# Patient Record
Sex: Male | Born: 1990 | Race: White | Hispanic: No | Marital: Single | Smoking: Never smoker
Health system: Southern US, Community
[De-identification: ages and names within clinical notes are randomized; demographics above are authoritative.]

## PROBLEM LIST (undated history)

## (undated) DIAGNOSIS — B1920 Unspecified viral hepatitis C without hepatic coma: Secondary | ICD-10-CM

## (undated) DIAGNOSIS — K219 Gastro-esophageal reflux disease without esophagitis: Secondary | ICD-10-CM

## (undated) DIAGNOSIS — F1910 Other psychoactive substance abuse, uncomplicated: Secondary | ICD-10-CM

## (undated) HISTORY — DX: Gastro-esophageal reflux disease without esophagitis: K21.9

## (undated) HISTORY — DX: Unspecified viral hepatitis C without hepatic coma: B19.20

---

## 2012-04-20 ENCOUNTER — Emergency Department (HOSPITAL_COMMUNITY): Payer: BC Managed Care – PPO

## 2012-04-20 ENCOUNTER — Emergency Department (HOSPITAL_COMMUNITY)
Admission: EM | Admit: 2012-04-20 | Discharge: 2012-04-21 | Disposition: A | Discharge status: Home / Self Care | Payer: BC Managed Care – PPO | Attending: Emergency Medicine | Admitting: Emergency Medicine

## 2012-04-20 ENCOUNTER — Encounter (HOSPITAL_COMMUNITY): Payer: Self-pay | Admitting: Emergency Medicine

## 2012-04-20 DIAGNOSIS — S20229A Contusion of unspecified back wall of thorax, initial encounter: Secondary | ICD-10-CM

## 2012-04-20 DIAGNOSIS — M549 Dorsalgia, unspecified: Secondary | ICD-10-CM

## 2012-04-20 DIAGNOSIS — R109 Unspecified abdominal pain: Secondary | ICD-10-CM | POA: Insufficient documentation

## 2012-04-20 LAB — COMPREHENSIVE METABOLIC PANEL
AST: 94 U/L — ABNORMAL HIGH (ref 0–37)
Albumin: 4.3 g/dL (ref 3.5–5.2)
Alkaline Phosphatase: 63 U/L (ref 39–117)
BUN: 10 mg/dL (ref 6–23)
Chloride: 99 mEq/L (ref 96–112)
Potassium: 3.9 mEq/L (ref 3.5–5.1)
Total Bilirubin: 1.4 mg/dL — ABNORMAL HIGH (ref 0.3–1.2)

## 2012-04-20 LAB — URINALYSIS, ROUTINE W REFLEX MICROSCOPIC
Glucose, UA: NEGATIVE mg/dL
Ketones, ur: NEGATIVE mg/dL
Protein, ur: 30 mg/dL — AB

## 2012-04-20 LAB — CBC WITH DIFFERENTIAL/PLATELET
Basophils Absolute: 0 10*3/uL (ref 0.0–0.1)
Basophils Relative: 0 % (ref 0–1)
Hemoglobin: 14.4 g/dL (ref 13.0–17.0)
Lymphocytes Relative: 10 % — ABNORMAL LOW (ref 12–46)
MCHC: 34.3 g/dL (ref 30.0–36.0)
Monocytes Relative: 9 % (ref 3–12)
Neutro Abs: 7.3 10*3/uL (ref 1.7–7.7)
Neutrophils Relative %: 81 % — ABNORMAL HIGH (ref 43–77)
WBC: 9 10*3/uL (ref 4.0–10.5)

## 2012-04-20 LAB — URINE MICROSCOPIC-ADD ON

## 2012-04-20 MED ORDER — MORPHINE SULFATE 4 MG/ML IJ SOLN
4.0000 mg | Freq: Once | INTRAMUSCULAR | Status: AC
  Administered 2012-04-20: 4 mg via INTRAVENOUS
  Filled 2012-04-20: qty 1

## 2012-04-20 MED ORDER — HYDROCODONE-ACETAMINOPHEN 5-325 MG PO TABS: 1.0000 | ORAL_TABLET | ORAL | Status: DC | PRN

## 2012-04-20 MED ORDER — IOHEXOL 300 MG/ML  SOLN: 100.0000 mL | Freq: Once | INTRAMUSCULAR | Status: AC | PRN

## 2012-04-20 MED ORDER — SODIUM CHLORIDE 0.9 % IV BOLUS (SEPSIS): 1000.0000 mL | Freq: Once | INTRAVENOUS | Status: DC

## 2012-04-20 MED ORDER — KETOROLAC TROMETHAMINE 30 MG/ML IJ SOLN
30.0000 mg | Freq: Once | INTRAMUSCULAR | Status: AC
  Administered 2012-04-20: 30 mg via INTRAVENOUS
  Filled 2012-04-20: qty 1

## 2012-04-20 MED ORDER — ONDANSETRON HCL 4 MG/2ML IJ SOLN
4.0000 mg | Freq: Once | INTRAMUSCULAR | Status: AC
  Administered 2012-04-20: 4 mg via INTRAVENOUS
  Filled 2012-04-20: qty 2

## 2012-04-20 MED ORDER — MORPHINE SULFATE 4 MG/ML IJ SOLN
6.0000 mg | Freq: Once | INTRAMUSCULAR | Status: AC
  Administered 2012-04-20: 6 mg via INTRAVENOUS
  Filled 2012-04-20: qty 2

## 2012-04-20 MED ORDER — SODIUM CHLORIDE 0.9 % IV BOLUS (SEPSIS)
1000.0000 mL | Freq: Once | INTRAVENOUS | Status: AC
  Administered 2012-04-20: 1000 mL via INTRAVENOUS
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
## 2012-04-20 NOTE — ED Notes (Signed)
Dr. Fonnie Jarvis notified of pt and given x-rays for review.  Labs and x-rays ordered by Dr. Fonnie Jarvis.

## 2012-04-20 NOTE — ED Notes (Signed)
Pt has copy of x-rays with him.

## 2012-04-20 NOTE — ED Notes (Signed)
Pt also reports bright red blood in stool x 1 today.  Acuity increased to 3.

## 2012-04-21 NOTE — ED Provider Notes (Signed)
  I performed a history and physical examination of Rodney Whitaker and discussed his management with Dr. Colbert Coyer.  I agree with the history, physical, assessment, and plan of care, with the following exceptions: None    Harlin Mazzoni, Elvis Coil, MD 04/21/12 0127

## 2012-12-11 ENCOUNTER — Encounter: Payer: Self-pay | Admitting: Physician Assistant

## 2012-12-11 ENCOUNTER — Ambulatory Visit (INDEPENDENT_AMBULATORY_CARE_PROVIDER_SITE_OTHER): Payer: BC Managed Care – PPO | Admitting: Physician Assistant

## 2012-12-11 VITALS — BP 133/78 | HR 72 | Temp 99.4°F | Ht 73.0 in | Wt 194.6 lb

## 2012-12-11 DIAGNOSIS — K219 Gastro-esophageal reflux disease without esophagitis: Secondary | ICD-10-CM

## 2012-12-11 MED ORDER — PANTOPRAZOLE SODIUM 40 MG PO TBEC: 40.0000 mg | DELAYED_RELEASE_TABLET | Freq: Every day | ORAL | Status: DC

## 2012-12-11 NOTE — Progress Notes (Signed)
Subjective:     Patient ID: Rodney Whitaker, male   DOB: 07-29-1990, 22 y.o.   MRN: [id number]  Back Pain This is a recurrent problem. The current episode started more than 1 year ago. The problem occurs intermittently. The problem has been waxing and waning since onset. The pain is present in the lumbar spine. The quality of the pain is described as aching. The pain does not radiate. The pain is at a severity of 4/10. The pain is the same all the time. The symptoms are aggravated by bending, position and twisting. He has tried analgesics, heat and home exercises for the symptoms. The treatment provided mild relief.  Gastrophageal Reflux He complains of belching, coughing, heartburn and nausea. This is a chronic problem. The current episode started more than 1 year ago. The problem occurs occasionally. The problem has been waxing and waning. The heartburn duration is an hour. The heartburn is located in the substernum. The heartburn is of moderate intensity. The heartburn wakes him from sleep. The heartburn does not limit his activity. The heartburn doesn't change with position. The symptoms are aggravated by certain foods. Risk factors include caffeine use and ETOH use. He has tried an antacid and a PPI for the symptoms. The treatment provided mild relief.     Review of Systems  Respiratory: Positive for cough.   Gastrointestinal: Positive for heartburn and nausea.  Musculoskeletal: Positive for back pain.  All other systems reviewed and are negative.       Objective:   Physical Exam  HENT:  Mouth/Throat: Oropharynx is clear and moist.  Abdominal: Soft. Bowel sounds are normal. He exhibits no distension and no mass. There is no tenderness. There is no rebound and no guarding.  Musculoskeletal:       Lumbar back: He exhibits tenderness. He exhibits normal range of motion, no bony tenderness, no swelling, no edema, no deformity, no pain and no spasm.  Neurological: He has normal strength and  normal reflexes.  Reflex Scores:      Patellar reflexes are 2+ on the right side and 2+ on the left side.      Achilles reflexes are 2+ on the right side and 2+ on the left side. SLR neg     Assessment:     GERD Back pain     Plan:     ROM strength. exercises for back Heat/Ice [employer] lifting [employer]. Protonix rx Hold caff/ETOH products ? Possible lactose intol Stay upright 20 min after eating meals Given FH of gallbladder dz f/u if sx cont

## 2012-12-11 NOTE — Patient Instructions (Signed)
Back Pain, Adult Low back pain is very common. About 1 in 5 people have back pain.The cause of low back pain is rarely dangerous. The pain often gets better over time.About half of people with a sudden onset of back pain feel better in just 2 weeks. About 8 in 10 people feel better by 6 weeks.  CAUSES Some common causes of back pain include:  Strain of the muscles or ligaments supporting the spine.  Wear and tear (degeneration) of the spinal discs.  Arthritis.  Direct injury to the back. DIAGNOSIS Most of the time, the direct cause of low back pain is not known.However, back pain can be treated effectively even when the exact cause of the pain is unknown.Answering your caregiver's questions about your overall health and symptoms is one of the most accurate ways to make sure the cause of your pain is not dangerous. If your caregiver needs more information, he or she may order lab work or imaging tests (X-rays or MRIs).However, even if imaging tests show changes in your back, this usually does not require surgery. HOME CARE INSTRUCTIONS For many people, back pain returns.Since low back pain is rarely dangerous, it is often a condition that people can learn to manageon their own.   Remain active. It is stressful on the back to sit or stand in one place. Do not sit, drive, or stand in one place for more than 30 minutes at a time. Take short walks on level surfaces as soon as pain allows.Try to increase the length of time you walk each day.  Do not stay in bed.Resting more than 1 or 2 days can delay your recovery.  Do not avoid exercise or work.Your body is made to move.It is not dangerous to be active, even though your back may hurt.Your back will likely heal faster if you return to being active before your pain is gone.  Pay attention to your body when you bend and lift. Many people have less discomfortwhen lifting if they bend their knees, keep the load close to their bodies,and  avoid twisting. Often, the most comfortable positions are those that put less stress on your recovering back.  Find a comfortable position to sleep. Use a firm mattress and lie on your side with your knees slightly bent. If you lie on your back, put a pillow under your knees.  Only take over-the-counter or prescription medicines as directed by your caregiver. Over-the-counter medicines to reduce pain and inflammation are often the most helpful.Your caregiver may prescribe muscle relaxant drugs.These medicines help dull your pain so you can more quickly return to your normal activities and healthy exercise.  Put ice on the injured area.  Put ice in a plastic bag.  Place a towel between your skin and the bag.  Leave the ice on for 15 to 20 minutes, 3 to 4 times a day for the first 2 to 3 days. After that, ice and heat may be alternated to reduce pain and spasms.  Ask your caregiver about trying back exercises and gentle massage. This may be of some benefit.  Avoid feeling anxious or stressed.Stress increases muscle tension and can worsen back pain.It is important to recognize when you are anxious or stressed and learn ways to manage it.Exercise is a great option. SEEK MEDICAL CARE IF:  You have pain that is not relieved with rest or medicine.  You have pain that does not improve in 1 week.  You have new symptoms.  You are generally   not feeling well. SEEK IMMEDIATE MEDICAL CARE IF:   You have pain that radiates from your back into your legs.  You develop new bowel or bladder control problems.  You have unusual weakness or numbness in your arms or legs.  You develop nausea or vomiting.  You develop abdominal pain.  You feel faint. Document Released: 07/11/2005 Document Revised: 01/10/2012 Document Reviewed: 11/29/2010 [facility] Patient Information 2013 [place], [place]. Back Exercises Back exercises help treat and prevent back injuries. The goal is to increase your strength  in your belly (abdominal) and back muscles. These exercises can also help with flexibility. Start these exercises when told by your doctor. HOME CARE Back exercises include: Pelvic Tilt.  Lie on your back with your knees bent. Tilt your pelvis until the lower part of your back is against the floor. Hold this position 5 to 10 sec. Repeat this exercise 5 to 10 times. Knee to Chest.  Pull 1 knee up against your chest and hold for 20 to 30 seconds. Repeat this with the other knee. This may be done with the other leg straight or bent, whichever feels better. Then, pull both knees up against your chest. Sit-Ups or Curl-Ups.  Bend your knees 90 degrees. Start with tilting your pelvis, and do a partial, slow sit-up. Only lift your upper half 30 to 45 degrees off the floor. Take at least 2 to 3 seonds for each sit-up. Do not do sit-ups with your knees out straight. If partial sit-ups are difficult, simply do the above but with only tightening your belly (abdominal) muscles and holding it as told. Hip-Lift.  Lie on your back with your knees flexed 90 degrees. Push down with your feet and shoulders as you raise your hips 2 inches off the floor. Hold for 10 seconds, repeat 5 to 10 times. Back Arches.  Lie on your stomach. Prop yourself up on bent elbows. Slowly press on your hands, causing an arch in your low back. Repeat 3 to 5 times. Shoulder-Lifts.  Lie face down with arms beside your body. Keep hips and belly pressed to floor as you slowly lift your head and shoulders off the floor. Do not overdo your exercises. Be careful in the beginning. Exercises may cause you some mild back discomfort. If the pain lasts for more than 15 minutes, stop the exercises until you see your doctor. Improvement with exercise for back problems is slow.  Document Released: 08/13/2010 Document Revised: 10/03/2011 Document Reviewed: 05/12/2011 [facility] Patient Information 2013 [place], [place].

## 2013-04-01 ENCOUNTER — Other Ambulatory Visit: Payer: Self-pay | Admitting: Physician Assistant

## 2013-05-30 ENCOUNTER — Other Ambulatory Visit: Payer: Self-pay

## 2014-01-15 ENCOUNTER — Ambulatory Visit (INDEPENDENT_AMBULATORY_CARE_PROVIDER_SITE_OTHER): Payer: BC Managed Care – PPO | Admitting: Physician Assistant

## 2014-01-15 VITALS — BP 122/82 | HR 80 | Temp 98.6°F | Ht 73.0 in | Wt 180.0 lb

## 2014-01-15 DIAGNOSIS — F411 Generalized anxiety disorder: Secondary | ICD-10-CM

## 2014-01-15 MED ORDER — SERTRALINE HCL 50 MG PO TABS
50.0000 mg | ORAL_TABLET | Freq: Every day | ORAL | Status: DC
Start: 2014-01-15 — End: 2015-03-13

## 2014-01-15 NOTE — Progress Notes (Signed)
Subjective:     Patient ID: Rodney Whitaker, male   DOB: 01/18/1991, 23 y.o.   MRN: [id number]  HPI Pt here due to anxiety His wife of 2 1/2 yrs and him are going through a separation She has moved back to [place] with her family His 25 y/o daughter is with his wife He has continued with work No hx of same + FH of anxiety  Review of Systems + heart palp + trouble sleeping due to wandering thoughts Decrease in concentration and fatigue    Objective:   Physical Exam Good eye contact Good interaction Mood flat during interview Face to face 20 min    Assessment:     Situational anxiety    Plan:     Would like him to get in to Counseling services Zoloft 50mg  daily- SE of meds reviewed Nl course reviewed with pt F/U in 3 weeks for review

## 2014-01-15 NOTE — Patient Instructions (Signed)
Panic Attacks  Panic attacks are sudden, short-lived surges of severe anxiety, fear, or discomfort. They may occur for no reason when you are relaxed, when you are anxious, or when you are sleeping. Panic attacks may occur for a number of reasons:   · Healthy people occasionally have panic attacks in extreme, life-threatening situations, such as war or natural disasters. Normal anxiety is a protective mechanism of the body that helps us react to danger (fight or flight response).  · Panic attacks are often seen with anxiety disorders, such as panic disorder, social anxiety disorder, generalized anxiety disorder, and phobias. Anxiety disorders cause excessive or uncontrollable anxiety. They may interfere with your relationships or other life activities.  · Panic attacks are sometimes seen with other mental illnesses, such as depression and posttraumatic stress disorder.  · Certain medical conditions, prescription medicines, and drugs of abuse can cause panic attacks.  SYMPTOMS   Panic attacks start suddenly, peak within 20 minutes, and are accompanied by four or more of the following symptoms:  · Pounding heart or fast heart rate (palpitations).  · Sweating.  · Trembling or shaking.  · Shortness of breath or feeling smothered.  · Feeling choked.  · Chest pain or discomfort.  · Nausea or strange feeling in your stomach.  · Dizziness, light-headedness, or feeling like you will faint.  · Chills or hot flushes.  · Numbness or tingling in your lips or hands and feet.  · Feeling that things are not real or feeling that you are not yourself.  · Fear of losing control or going crazy.  · Fear of dying.  Some of these symptoms can mimic serious medical conditions. For example, you may think you are having a heart attack. Although panic attacks can be very scary, they are not life threatening.  DIAGNOSIS   Panic attacks are diagnosed through an assessment by your health care Helmer Dull. Your health care Citlalic Norlander will ask  questions about your symptoms, such as where and when they occurred. Your health care Teryl Gubler will also ask about your medical history and use of alcohol and drugs, including prescription medicines. Your health care Jahara Dail may order blood tests or other studies to rule out a serious medical condition. Your health care Mekisha Bittel may refer you to a mental health professional for further evaluation.  TREATMENT   · Most healthy people who have one or two panic attacks in an extreme, life-threatening situation will not require treatment.  · The treatment for panic attacks associated with anxiety disorders or other mental illness typically involves counseling with a mental health professional, medicine, or a combination of both. Your health care Christen Wardrop will help determine what treatment is best for you.  · Panic attacks due to physical illness usually go away with treatment of the illness. If prescription medicine is causing panic attacks, talk with your health care Nakea Gouger about stopping the medicine, decreasing the dose, or substituting another medicine.  · Panic attacks due to alcohol or drug abuse go away with abstinence. Some adults need professional help in order to stop drinking or using drugs.  HOME CARE INSTRUCTIONS   · Take all medicines as directed by your health care Nollie Terlizzi.    · Schedule and attend follow-up visits as directed by your health care Krystiana Fornes. It is important to keep all your appointments.  SEEK MEDICAL CARE IF:  · You are not able to take your medicines as prescribed.  · Your symptoms do not improve or get worse.  SEEK IMMEDIATE MEDICAL   CARE IF:   · You experience panic attack symptoms that are different than your usual symptoms.  · You have serious thoughts about hurting yourself or others.  · You are taking medicine for panic attacks and have a serious side effect.  MAKE SURE YOU:  · Understand these instructions.  · Will watch your condition.  · Will get help right away if you are not  doing well or get worse.  Document Released: 07/11/2005 Document Revised: 07/16/2013 Document Reviewed: 02/22/2013  [employer]® Patient Information ©2015 [employer]. This information is not intended to replace advice given to you by your health care Carlicia Leavens. Make sure you discuss any questions you have with your health care Terance Pomplun.

## 2014-02-28 ENCOUNTER — Encounter: Payer: Self-pay | Admitting: Family Medicine

## 2014-02-28 ENCOUNTER — Ambulatory Visit (INDEPENDENT_AMBULATORY_CARE_PROVIDER_SITE_OTHER): Payer: Self-pay | Admitting: Family Medicine

## 2014-02-28 VITALS — BP 110/70 | HR 84 | Temp 99.1°F | Ht 73.0 in | Wt 164.0 lb

## 2014-02-28 DIAGNOSIS — T7840XA Allergy, unspecified, initial encounter: Secondary | ICD-10-CM

## 2014-02-28 NOTE — Progress Notes (Signed)
   Subjective:    Patient ID: Rodney ShamsKevin Whitaker, male    DOB: 03/02/1991, 23 y.o.   MRN: [id number]  HPI  C/o hands and feet swelling.  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  He took a couple of benadryl that night and woke up the Next day and the sx's resolved.  Review of Systems No chest pain, SOB, HA, dizziness, vision change, N/V, diarrhea, constipation, dysuria, urinary urgency or frequency, myalgias, arthralgias or rash.     Objective:   Physical Exam  Vital signs noted  Well developed well nourished male.  HEENT - Head atraumatic Normocephalic                Eyes - PERRLA, Conjuctiva - clear Sclera- Clear EOMI                Ears - EAC's Wnl TM's Wnl Gross Hearing WNL                Nose - Nares patent                 Throat - oropharanx wnl Respiratory - Lungs CTA bilateral Cardiac - RRR S1 and S2 without murmur GI - Abdomen soft Nontender and bowel sounds active x 4 Extremities - No edema. Neuro - Grossly intact.      Assessment & Plan:  Allergic reaction, initial encounter Continue benadryl and reassured patient and follow up prn.  Deatra CanterWilliam J Oxford FNP

## 2015-01-15 ENCOUNTER — Observation Stay (HOSPITAL_COMMUNITY)
Admission: EM | Admit: 2015-01-15 | Discharge: 2015-01-15 | Disposition: A | Discharge status: Home / Self Care | Payer: BLUE CROSS/BLUE SHIELD | Attending: Emergency Medicine | Admitting: Emergency Medicine

## 2015-01-15 ENCOUNTER — Observation Stay (HOSPITAL_BASED_OUTPATIENT_CLINIC_OR_DEPARTMENT_OTHER)
Admit: 2015-01-15 | Discharge: 2015-01-15 | Disposition: A | Discharge status: Home / Self Care | Payer: BLUE CROSS/BLUE SHIELD

## 2015-01-15 ENCOUNTER — Emergency Department (HOSPITAL_COMMUNITY): Payer: BLUE CROSS/BLUE SHIELD

## 2015-01-15 ENCOUNTER — Encounter (HOSPITAL_COMMUNITY): Payer: Self-pay | Admitting: Emergency Medicine

## 2015-01-15 DIAGNOSIS — I808 Phlebitis and thrombophlebitis of other sites: Secondary | ICD-10-CM | POA: Diagnosis not present

## 2015-01-15 DIAGNOSIS — K219 Gastro-esophageal reflux disease without esophagitis: Secondary | ICD-10-CM | POA: Diagnosis not present

## 2015-01-15 DIAGNOSIS — F1990 Other psychoactive substance use, unspecified, uncomplicated: Secondary | ICD-10-CM

## 2015-01-15 DIAGNOSIS — I809 Phlebitis and thrombophlebitis of unspecified site: Secondary | ICD-10-CM | POA: Diagnosis not present

## 2015-01-15 DIAGNOSIS — R52 Pain, unspecified: Secondary | ICD-10-CM

## 2015-01-15 DIAGNOSIS — Z79899 Other long term (current) drug therapy: Secondary | ICD-10-CM | POA: Insufficient documentation

## 2015-01-15 DIAGNOSIS — M79605 Pain in left leg: Secondary | ICD-10-CM | POA: Diagnosis not present

## 2015-01-15 DIAGNOSIS — R200 Anesthesia of skin: Secondary | ICD-10-CM | POA: Diagnosis present

## 2015-01-15 DIAGNOSIS — F1110 Opioid abuse, uncomplicated: Secondary | ICD-10-CM | POA: Diagnosis not present

## 2015-01-15 LAB — CBC WITH DIFFERENTIAL/PLATELET
BASOS PCT: 1 % (ref 0–1)
Basophils Absolute: 0 10*3/uL (ref 0.0–0.1)
EOS ABS: 0 10*3/uL (ref 0.0–0.7)
Eosinophils Relative: 1 % (ref 0–5)
HCT: 33.9 % — ABNORMAL LOW (ref 39.0–52.0)
Hemoglobin: 11.3 g/dL — ABNORMAL LOW (ref 13.0–17.0)
LYMPHS ABS: 1.2 10*3/uL (ref 0.7–4.0)
Lymphocytes Relative: 29 % (ref 12–46)
MCH: 26.2 pg (ref 26.0–34.0)
MCHC: 33.3 g/dL (ref 30.0–36.0)
MCV: 78.7 fL (ref 78.0–100.0)
MONOS PCT: 16 % — AB (ref 3–12)
Monocytes Absolute: 0.7 10*3/uL (ref 0.1–1.0)
NEUTROS PCT: 53 % (ref 43–77)
Neutro Abs: 2.1 10*3/uL (ref 1.7–7.7)
PLATELETS: 380 10*3/uL (ref 150–400)
RBC: 4.31 MIL/uL (ref 4.22–5.81)
RDW: 14.2 % (ref 11.5–15.5)
WBC: 4 10*3/uL (ref 4.0–10.5)

## 2015-01-15 LAB — BASIC METABOLIC PANEL
ANION GAP: 9 (ref 5–15)
BUN: 8 mg/dL (ref 6–20)
CALCIUM: 9.7 mg/dL (ref 8.9–10.3)
CO2: 26 mmol/L (ref 22–32)
CREATININE: 0.81 mg/dL (ref 0.61–1.24)
Chloride: 105 mmol/L (ref 101–111)
GFR calc Af Amer: >60 mL/min (ref >60)
GFR calc non Af Amer: >60 mL/min (ref >60)
Glucose, Bld: 103 mg/dL — ABNORMAL HIGH (ref 65–99)
Potassium: 3.6 mmol/L (ref 3.5–5.1)
Sodium: 140 mmol/L (ref 135–145)

## 2015-01-15 LAB — BRAIN NATRIURETIC PEPTIDE: B Natriuretic Peptide: 6 pg/mL (ref 0.0–100.0)

## 2015-01-15 LAB — I-STAT TROPONIN, ED: Troponin i, poc: 0 ng/mL (ref 0.00–0.08)

## 2015-01-15 MED ORDER — SULFAMETHOXAZOLE-TRIMETHOPRIM 800-160 MG PO TABS: 1.0000 | ORAL_TABLET | Freq: Three times a day (TID) | ORAL | Status: DC

## 2015-01-15 MED ORDER — CEPHALEXIN 500 MG PO CAPS: 500.0000 mg | ORAL_CAPSULE | Freq: Four times a day (QID) | ORAL | Status: DC

## 2015-01-15 NOTE — Progress Notes (Signed)
VASCULAR LAB PRELIMINARY  PRELIMINARY  PRELIMINARY  PRELIMINARY  Left upper extremity venous Doppler completed.    Preliminary report:  There is no DVT or SVT noted in the left upper extremity.    Rodney Whitaker, RVT 01/15/2015, 6:51 PM

## 2015-01-15 NOTE — Discharge Instructions (Signed)
Phlebitis Phlebitis is soreness and swelling (inflammation) of a vein. This can occur in your arms, legs, or torso (trunk), as well as deeper inside your body. Phlebitis is usually not serious when it occurs close to the surface of the body. However, it can cause serious problems when it occurs in a vein deeper inside the body. CAUSES  Phlebitis can be triggered by various things, including:   Reduced blood flow through your veins. This can happen with:  Bed rest over a long period.  Long-distance travel.  Injury.  Surgery.  Being overweight (obese) or pregnant.  Having an IV tube put in the vein and getting certain medicines through the vein.  Cancer and cancer treatment.  Use of illegal drugs taken through the vein.  Inflammatory diseases.  Inherited (genetic) diseases that increase the risk of blood clots.  Hormone therapy, such as birth control pills. SIGNS AND SYMPTOMS   Red, tender, swollen, and painful area on your skin. Usually, the area will be long and narrow.  Firmness along the center of the affected area. This can indicate that a blood clot has formed.  Low-grade fever. DIAGNOSIS  A health care provider can usually diagnose phlebitis by examining the affected area and asking about your symptoms. To check for infection or blood clots, your health care provider may order blood tests or an ultrasound exam of the area. Blood tests and your family history may also indicate if you have an underlying genetic disease that causes blood clots. Occasionally, a piece of tissue is taken from the body (biopsy sample) if an unusual cause of phlebitis is suspected. TREATMENT  Treatment will vary depending on the severity of the condition and the area of the body affected. Treatment may include:  Use of a warm compress or heating pad.  Use of compression stockings or bandages.  Anti-inflammatory medicines.  Removal of any IV tube that may be causing the  problem.  Medicines that kill germs (antibiotics) if an infection is present.  Blood-thinning medicines if a blood clot is suspected or present.  In rare cases, surgery may be needed to remove damaged sections of vein. HOME CARE INSTRUCTIONS   Only take over-the-counter or prescription medicines as directed by your health care provider. Take all medicines exactly as prescribed.  Raise (elevate) the affected area above the level of your heart as directed by your health care provider.  Apply a warm compress or heating pad to the affected area as directed by your health care provider. Do not sleep with the heating pad.  Use compression stockings or bandages as directed. These will speed healing and prevent the condition from coming back.  If you are on blood thinners:  Get follow-up blood tests as directed by your health care provider.  Check with your health care provider before using any new medicines.  Carry a medical alert card or wear your medical alert jewelry to show that you are on blood thinners.  For phlebitis in the legs:  Avoid prolonged standing or bed rest.  Keep your legs moving. Raise your legs when sitting or lying.  Do not smoke.  Women, particularly those over the age of 12, should consider the risks and benefits of taking the contraceptive pill. This kind of hormone treatment can increase your risk for blood clots.  Follow up with your health care provider as directed. SEEK MEDICAL CARE IF:   You have unusual bruising or any bleeding problems.  Your swelling or pain in the  affected area is not improving.  You are on anti-inflammatory medicine, and you develop belly (abdominal) pain. SEEK IMMEDIATE MEDICAL CARE IF:   You have a sudden onset of chest pain or difficulty breathing.  You have a fever or persistent symptoms for more than 2-3 days.  You have a fever and your symptoms suddenly get worse. MAKE SURE YOU:  Understand these  instructions.  Will watch your condition.  Will get help right away if you are not doing well or get worse. Document Released: 07/05/2001 Document Revised: 05/01/2013 Document Reviewed: 03/18/2013 [facility] Patient Information 2015 [place], [place]. This information is not intended to replace advice given to you by your health care provider. Make sure you discuss any questions you have with your health care provider.    Emergency Department Resource Guide 1) Find a Doctor and Pay Out of Pocket Although you won't have to find out who is covered by your insurance plan, it is a good idea to ask around and get recommendations. You will then need to call the office and see if the doctor you have chosen will accept you as a new patient and what types of options they offer for patients who are self-pay. Some doctors offer discounts or will set up payment plans for their patients who do not have insurance, but you will need to ask so you aren't surprised when you get to your appointment.  2) Contact Your Local Health Department Not all health departments have doctors that can see patients for sick visits, but many do, so it is worth a call to see if yours does. If you don't know where your local health department is, you can check in your phone book. The CDC also has a tool to help you locate your state's health department, and many state websites also have listings of all of their local health departments.  3) Find a Walk-in Clinic If your illness is not likely to be very severe or complicated, you may want to try a walk in clinic. These are popping up all over the country in pharmacies, drugstores, and shopping centers. They're usually staffed by nurse practitioners or physician assistants that have been trained to treat common illnesses and complaints. They're usually fairly quick and inexpensive. However, if you have serious medical issues or chronic medical problems, these are probably not your best  option.  No Primary Care Doctor: - Call Health Connect at  [phone] - they can help you locate a primary care doctor that  accepts your insurance, provides certain services, etc. - Physician Referral Service- [phone]  Chronic Pain Problems: Organization         Address  Phone   Notes  [facility]  [phone] Patients need to be referred by their primary care doctor.   Medication Assistance: Organization         Address  Phone   Notes  [facility] Medication [facility] [address] [place] [place], [place] [address] [phone] --Must be a resident of [facility] -- Must have NO insurance coverage whatsoever (no Medicaid/ Medicare, etc.) -- The pt. MUST have a primary care doctor that directs their care regularly and follows them in the community   [facility]ist  [phone]   [employer]  [phone]    Agencies that provide inexpensive medical care: Organization         Address  Phone   Notes  [facility]  [phone]  [facility]    [phone]   [facility] [place], [place] [address] [phone]   [place]. [address] [place] [phone]   Planned Parenthood    [phone]   [facility]    [phone]   [place] and [place] [address], [place] Phone:  [phone], Fax:  [phone] Hours of Operation:  9 am - 6 pm, M-F.  Also accepts Medicaid/Medicare and self-pay.  [facility] for [place] [address], [address], [place] Phone: [phone], Fax: [phone]. Hours of Operation:  8:30 am - 5:30 pm, M-F.  Also accepts Medicaid and self-pay.  [facility] [place] [address] [place] Phone: [phone]   [place], [place], [place] [phone], Ext. [phone] Mondays & Thursdays: 7-9 AM.  First 15  patients are seen on a first come, first serve basis.    [place] Providers:  Organization         Address  Phone   Notes  [facility] [address], [place] [phone] Also accepts self-pay patients.  [facility] [id number] [place], [place]  [phone]   [place], [address], [place] [phone]   [facility] [address] [place] [phone]   Lucianne Lei [address], [place]   [phone] Only accepts [place] Access [place] patients after they have their name applied to their card.   Self-Pay (no insurance) in [facility]:  Organization         Address  Phone   Notes  Sickle Cell Patients, [facility] [place] [phone]   [facility] [place] [phone]   [facility] [place]  [place], [place], [place] [phone]   [facility]/Dr. Osei-Bonsu  [address] [place] or [address], [place] [phone] Phone number for both [place] and [place] locations is the same.  Urgent Medical and [facility] [address], [place] [phone]   [facility] [address], [place] or [address] [phone]   [facility] [address] [place] [phone], phone; [phone], fax Sees patients 1st and 3rd Saturday of every month.  Must not qualify for public or private insurance (i.e. Medicaid, Medicare, [place] Health Choice, Veterans' Benefits)  Household income should be no more than 200% of the poverty level The clinic cannot treat you if you are pregnant or think you are pregnant  Sexually transmitted diseases are not treated at the clinic.    Dental  Care: Organization         Address  Phone  Notes  [facility] Department of [facility] [place] [phone] Accepts children up to age 90 who are enrolled in [place] or [place]; pregnant women with a Medicaid card; and children who have applied for Medicaid or  Health Choice, but were declined, whose parents can pay a reduced fee at time of service.  [facility] Department of [facility]  [address],  [place] [phone] Accepts children up to age 36 who are enrolled in Medicaid or [place] Health Choice; pregnant women with a Medicaid card; and children who have applied for Medicaid or  Health Choice, but were declined, whose parents can pay a reduced fee at time of service.  [place] Adult Dental Access PROGRAM  [place] [phone] Patients are seen by appointment only. Walk-ins are not accepted. [place] will see patients 35 years of age and older. Monday - Tuesday (8am-5pm) Most Wednesdays (8:30-5pm) $30 per visit, cash only  [facility] Adult Dental Access PROGRAM  [address], [facility] [phone] Patients are seen by appointment only. Walk-ins are not accepted. [place] will see patients 74 years of age and older. One Wednesday Evening (Monthly: Volunteer Based).  $30 per visit, cash only  [place]  [phone] for adults; Children under age 77, call Graduate Pediatric Dentistry at [phone]. Children aged 61-14, please call [phone] to request a pediatric application.  Dental services are provided in all areas of dental care including fillings, crowns and bridges, complete and partial dentures, implants, gum treatment, root canals, and extractions. Preventive care is also provided. Treatment is provided to both adults and children. Patients are selected via a lottery and there is often a waiting list.   [facility] [address], [place]  [phone] [email or web address]   [facility] [address] [place], [place] [phone], Ext. [phone] Second and Fourth Thursday of each month, opens at 6:30 AM; Clinic ends at 9 AM.  Patients are seen on a first-come first-served basis, and a limited number are seen during each clinic.   [facility]  [address] Hillard Danker [place], [place] [phone]   Eligibility Requirements You must have lived in [place], [place], or [place] counties for at least the last three months.   You cannot be eligible for state or federal sponsored [employer], including [employer], [place], or [employer].   You generally cannot be eligible for healthcare insurance through your employer.    How to apply: Eligibility screenings are held every Tuesday and Wednesday afternoon from 1:00 pm until 4:00 pm. You do not need an appointment for the interview!  [facility] [address], [place], [place]   [place]  [place]  [place]  [phone]    Behavioral Health Resources in the Community: Intensive Outpatient Programs Organization         Address  Phone  Notes  [facility] [place]. [address] [place], [place] [phone]   [facility] Outpatient [address] [place], [place]   ADS: [facility] Drug [facility] [address] [place], [place]   [place] [address]  [place], [place] or [phone]   Substance Abuse Resources Organization         Address  Phone  Notes  Alcohol and Drug Services  [phone]   [place]  [phone]   [facility] [place]   Chinita Pester  [phone]   Residential & Outpatient Substance Abuse Program  [phone]    Psychological Services Organization         Address  Phone  Notes  [place]  [place]  [phone]  [phone]   [facility] [address] [place] [phone] or [phone]    Mobile Crisis Teams Organization         Address  Phone  Notes  [facility], Mobile Crisis Care Unit  [phone]   [facility]  [address] [place], [place] [phone]   Doristine Locks [address] [place] [phone]    Self-Help/Support Groups Organization         Address  Phone             Notes  [facility] [place] - variety of support groups  336- I7437963 Call for more information  Narcotics Anonymous (NA), [facility] [address], [employer] [place]  2 meetings at this location   Statistician         Address  Phone  Notes  [facility] [address],    [place]  [phone]   [facility]  [address] [place] [address], [place], [place] [phone]   [facility] [address] [place], [place] [phone] Admissions: 8am-3pm M-F  [facility] [address],    [place], [place] [phone]   [facility] [address] [place], [place], [place] [phone]   [facility] [address],  [place], [place] [phone]   [facility] - Intensive Outpatient [address], [place], [place] [phone]   [facility] (Addiction Recovery [facility] [address] [place].,  [place], [place] [phone] or [phone]   [facility] ([facility]) [address], [place], [place] [phone] Accepts Medicaid  [facility] [place] [address],  [place] [phone] Substance Abuse/Addiction Treatment   [facility] Organization         Address  Phone  Notes  [facility]  [phone]   Angie Fava, PhD [address] [place]vin Knack [place], [place]   [phone] or [phone]   [facility]   [address] [place], [place] [phone]   [facility] [address], [place], [place] [phone] Insurance/Medicaid/sponsorship through [facility] [address]                                    [place], [place] [phone] Therapy/tele-psych/case  [facility] [address], [place] [phone]    Dr. Lolly Mustache  [phone]   [facility] [place]  [employer] [facility] Dept. 1) [address] [place] 2) [address], [place] 3)  [address] [place] [address], [place] [phone]  [phone]   [facility] Child Abuse Hotline (289) 671-4562 or [phone] (After Hours)      Opioid Withdrawal Opioids are a group of narcotic drugs. They include the street drug heroin. They also include pain medicines, such as morphine, hydrocodone, oxycodone, and fentanyl. Opioid withdrawal is a group of characteristic physical and mental signs and symptoms. It typically occurs if you have been using opioids daily for several weeks or longer and stop using or rapidly decrease use. Opioid withdrawal can also occur if you have used opioids daily for a long time and are given a medicine to block the effect.  SIGNS AND SYMPTOMS Opioid withdrawal includes three or more of  the following symptoms:   Depressed, anxious, or irritable mood.  Nausea or vomiting.  Muscle aches or spasms.   Watery eyes.   Runny nose.  Dilated pupils, sweating, or hairs standing on end.  Diarrhea or intestinal cramping.  Yawning.   Fever.  Increased blood pressure.  Fast pulse.  Restlessness or trouble sleeping. These signs and symptoms occur within several hours of stopping or reducing short-acting opioids, such as heroin. They can occur within 3 days of stopping or  reducing long-acting opioids, such as methadone. Withdrawal begins within minutes of receiving a drug that blocks the effects of opioids, such as naltrexone or naloxone. DIAGNOSIS  Opioid use disorder is diagnosed by your health care provider. You will be asked about your symptoms, drug and alcohol use, medical history, and use of medicines. A physical exam may be done. Lab tests may be ordered. Your health care provider may have you see a mental health professional.  TREATMENT  The treatment for opioid withdrawal is usually provided by medical doctors with special training in substance use disorders (addiction specialists). The following medicines may be included in treatment:  Opioids given in place of the abused opioid. They turn on opioid receptors in the brain and lessen or prevent withdrawal symptoms. They are gradually decreased (opioid substitution and taper).  Non-opioids that can lessen certain opioid withdrawal symptoms. They may be used alone or with opioid substitution and taper. Successful long-term recovery usually requires medicine, counseling, and group support. HOME CARE INSTRUCTIONS   Take medicines only as directed by your health care provider.  Check with your health care provider before starting new medicines.  Keep all follow-up visits as directed by your health care provider. SEEK MEDICAL CARE IF:  You are not able to take your medicines as directed.  Your symptoms get worse.  You relapse. SEEK IMMEDIATE MEDICAL CARE IF:  You have serious thoughts about hurting yourself or others.  You have a seizure.  You lose consciousness. Document Released: 07/14/2003 Document Revised: 11/25/2013 Document Reviewed: 07/24/2013 [facility] Patient Information 2015 [place], [place]. This information is not intended to replace advice given to you by your health care provider. Make sure you discuss any questions you have with your health care provider.

## 2015-01-15 NOTE — ED Provider Notes (Signed)
CSN: [id number]     Arrival date & time 01/15/15  1229 History   First MD Initiated Contact with Patient 01/15/15 1656     Chief Complaint  Patient presents with  . Numbness  . Leg Pain  . Shortness of Breath     (Consider location/radiation/quality/duration/timing/severity/associated sxs/prior Treatment) Patient is a 24 y.o. male presenting with leg pain and shortness of breath. The history is provided by the patient.  Leg Pain Associated symptoms: no back pain   Shortness of Breath Associated symptoms: no abdominal pain, no chest pain, no headaches, no rash and no vomiting    patient is an IV opiate abuser. Went to an urgent care because he was having pain in his left forearm and hand. Began a couple days ago. There has been redness and the feeling that there was something in the vein. States he drew out some blood and it was dark and extra thick. He injects oral opiates into the vein. Lasted did this a few days ago. States he has some numbness in that hand. States he also has pain that has gone up the arm. Also has had some pain in the other arm and some pain in the left leg. He will have a headache at times. Denies fever. Denies chest pain does have occasional shortness of breath.  Past Medical History  Diagnosis Date  . GERD (gastroesophageal reflux disease)    History reviewed. No pertinent past surgical history. Family History  Problem Relation Age of Onset  . Healthy Mother   . Cancer Father    History  Substance Use Topics  . Smoking status: Never Smoker   . Smokeless tobacco: Not on file  . Alcohol Use: Yes     Comment: occasional    Review of Systems  Constitutional: Negative for activity change and appetite change.  Eyes: Negative for pain.  Respiratory: Positive for shortness of breath. Negative for chest tightness.   Cardiovascular: Negative for chest pain and leg swelling.  Gastrointestinal: Negative for nausea, vomiting, abdominal pain and diarrhea.   Genitourinary: Negative for flank pain.  Musculoskeletal: Negative for back pain and neck stiffness.  Skin: Positive for color change and wound. Negative for rash.  Neurological: Positive for numbness. Negative for weakness and headaches.  Psychiatric/Behavioral: Negative for behavioral problems.      Allergies  Review of patient's allergies indicates no known allergies.  Home Medications   Prior to Admission medications   Medication Sig Start Date End Date Taking? Authorizing Provider  ibuprofen (ADVIL) 200 MG tablet Take 200 mg by mouth every 6 (six) hours as needed for headache, mild pain or moderate pain.   Yes Historical Provider, MD  omeprazole (PRILOSEC OTC) 20 MG tablet Take 20 mg by mouth daily.   Yes Historical Provider, MD  pantoprazole (PROTONIX) 40 MG tablet Take 40 mg by mouth daily. 01/09/15  Yes Historical Provider, MD  cephALEXin (KEFLEX) 500 MG capsule Take 1 capsule (500 mg total) by mouth 4 (four) times daily. 01/15/15   Benjiman Core, MD  sertraline (ZOLOFT) 50 MG tablet Take 1 tablet (50 mg total) by mouth daily. 1/2 tab daily for the first 6 days and then full tab daily Patient not taking: Reported on 01/15/2015 01/15/14   Inis Sizer, PA-C  sulfamethoxazole-trimethoprim (BACTRIM DS,SEPTRA DS) 800-160 MG per tablet Take 1 tablet by mouth 3 (three) times daily. 01/15/15   Benjiman Core, MD   BP 108/67 mmHg  Pulse 97  Temp(Src) 97.8 F (36.6 C) (Oral)  Resp 16  Ht 6' (1.829 m)  Wt 170 lb (77.111 kg)  BMI 23.05 kg/m2  SpO2 100% Physical Exam  Constitutional: He is oriented to person, place, and time. He appears well-developed and well-nourished.  HENT:  Head: Normocephalic and atraumatic.  Eyes: EOM are normal. Pupils are equal, round, and reactive to light.  Neck: Normal range of motion. Neck supple.  Cardiovascular: Normal rate, regular rhythm and normal heart sounds.   No murmur heard. Pulmonary/Chest: Effort normal and breath sounds normal.   Abdominal: Soft. Bowel sounds are normal. He exhibits no distension and no mass. There is no tenderness. There is no rebound and no guarding.  Musculoskeletal: Normal range of motion. He exhibits tenderness. He exhibits no edema.  Track marks to bilateral axilla and forearms. Some mild tenderness over distal forearm and injection site. There is some redness moving proximally from this. No fluctuance. No clear abscess. Sensation grossly intact over the fingers although he states there is numbness on his left hand. Good pulse in left foot. No tenderness to left calf.  Neurological: He is alert and oriented to person, place, and time. No cranial nerve deficit.  Skin: Skin is warm and dry.  Psychiatric: He has a normal mood and affect.  Nursing note and vitals reviewed.   ED Course  Procedures (including critical care time) Labs Review Labs Reviewed  BASIC METABOLIC PANEL - Abnormal; Notable for the following:    Glucose, Bld 103 (*)    All other components within normal limits  CBC WITH DIFFERENTIAL/PLATELET - Abnormal; Notable for the following:    Hemoglobin 11.3 (*)    HCT 33.9 (*)    Monocytes Relative 16 (*)    All other components within normal limits  CULTURE, BLOOD (ROUTINE X 2)  CULTURE, BLOOD (ROUTINE X 2)  BRAIN NATRIURETIC PEPTIDE  I-STAT TROPOININ, ED    Imaging Review Dg Chest 2 View  01/15/2015   CLINICAL DATA:  Shortness of breath for 2 days  EXAM: CHEST  2 VIEW  COMPARISON:  April 20, 2012  FINDINGS: The lungs are clear. The heart size and pulmonary vascularity are normal. No adenopathy. No bone lesions.  IMPRESSION: No abnormality noted.   Electronically Signed   By: Bretta Bang III M.D.   On: 01/15/2015 13:54     EKG Interpretation   Date/Time:  Thursday January 15 2015 12:55:59 EDT Ventricular Rate:  98 PR Interval:  130 QRS Duration: 90 QT Interval:  352 QTC Calculation: 449 R Axis:   87 Text Interpretation:  Normal sinus rhythm Minimal voltage  criteria for  LVH, may be normal variant ST elevation, consider early repolarization  Borderline ECG ED PHYSICIAN INTERPRETATION AVAILABLE IN [facility]  Confirmed by TEST, Record ([id number]) on 01/16/2015 7:41:43 AM      MDM   Final diagnoses:  Pain  Intravenous drug user  Phlebitis    Patient with multiple complaints including swelling and pain in his left arm numbness and IV drug abuse. There was worry for embolic phenomenon from his IV drug use, but he has been having no fevers. Culture sent. Internal medicine evaluated for admission but patient refused. Doppler done was negative will treat for cellulitis.    Benjiman Core, MD 01/17/15 [phone]

## 2015-01-15 NOTE — ED Notes (Signed)
No warmth, redness, or edema noted at areas of pt complaint. Pt states, "I feel my clot moving through my body and it's in my ear now".

## 2015-01-15 NOTE — ED Notes (Signed)
Pt sent here for further eval of left arm pain and numbness, SOB and left leg pain in calf area; pt hx of IV opana use; pt sts last used 2 days ago; pt sent for eval of thrombus

## 2015-01-15 NOTE — Consult Note (Signed)
[facility] Medical Consultation  Rodney Whitaker RTM:[id number] DOB: Sep 14, 1990 DOA: 01/15/2015 PCP: Rudi Heap, MD   Requesting physician: Dr Rubin Payor Date of consultation: 01/15/2015 Reason for consultation: Left arm pain  Impression/Recommendations Active Problems:   Phlebitis    1. IV drug abuse. Patient with history of IV drug abuse, last shooting up 2 days ago, presenting to the emergency department with complaints of pain and numbness involving his left upper extremity. On exam he had mild erythema over track marks in the antecubital region with with proximal extension. There were no fluctuant masses. Otherwise he was nontoxic appearing, lab work showing a normal white count. I offered admission however patient expressed preference is to undergo left upper extremity Dopplers in the emergency department then be discharged home from ER on oral antimicrobials therapy. I counseled him extensively on IV drug abuse and the serious dangers that this could pose on his health. Would recommend 7-10 days of empiric antimicrobials therapy. It appears blood cultures were drawn in the emergency room and will need follow-up. Patient instructed to immediately return to the emergency department if symptoms worsen.   I will followup again tomorrow. Please contact me if I can be of assistance in the meanwhile. Thank you for this consultation.  Chief Complaint: Left arm pain  HPI: Patient is a 24 year old gentleman with a history of intravenous drug use presenting to the emergency department with complaints of left arm pain associated with numbness. He reports injecting opana into his wrist and antecubital region, having been 2 days since he last used. He reports recently undergoing a drug binge. In the emergency room he has multiple complaints including pain involving the left forearm along with sensation of "something in my vein." He denies fevers, chills, nausea, vomiting, malaise, fatigue, chest  pain or shortness of breath. Lab work performed in the emergency department included a CBC that showed a white count of 4000. Chest x-ray showed no acute abnormality. On exam he had multiple track marks over wrist forearm region and antecubital region. He did not have fluctuant masses nor pain with palpation on physical exam. There was some minimal erythema associated with track marks over antecubital region.                                                                Review of Systems:  Constitutional:  No weight loss, night sweats, Fevers, chills, fatigue.  HEENT:  No headaches, Difficulty swallowing,Tooth/dental problems,Sore throat,  No sneezing, itching, ear ache, nasal congestion, post nasal drip,  Cardio-vascular:  No chest pain, Orthopnea, PND, swelling in lower extremities, anasarca, dizziness, palpitations  GI:  No heartburn, indigestion, abdominal pain, nausea, vomiting, diarrhea, change in bowel habits, loss of appetite  Resp:  No shortness of breath with exertion or at rest. No excess mucus, no productive cough, No non-productive cough, No coughing up of blood.No change in color of mucus.No wheezing.No chest wall deformity  Skin:  no rash or lesions.  GU:  no dysuria, change in color of urine, no urgency or frequency. No flank pain.  Musculoskeletal:  No joint pain or swelling. No decreased range of motion. No back pain.  Psych:  No change in mood or affect. No depression or anxiety. No memory loss  Past Medical History  Diagnosis Date  .  GERD (gastroesophageal reflux disease)    History reviewed. No pertinent past surgical history. Social History:  reports that he has never smoked. He does not have any smokeless tobacco history on file. He reports that he drinks alcohol. 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  No Known Allergies Family History  Problem Relation Age of Onset  . Healthy Mother   . Cancer Father     Prior to Admission medications   Medication  Sig Start Date End Date Taking? Authorizing Provider  omeprazole (PRILOSEC OTC) 20 MG tablet Take 20 mg by mouth daily.    Historical Provider, MD  sertraline (ZOLOFT) 50 MG tablet Take 1 tablet (50 mg total) by mouth daily. 1/2 tab daily for the first 6 days and then full tab daily 01/15/14   Inis Sizer, PA-C   Physical Exam: Blood pressure 123/80, pulse 67, temperature 98.4 F (36.9 C), temperature source Oral, resp. rate 20, height 6' (1.829 m), weight 77.111 kg (170 lb), SpO2 99 %. Filed Vitals:   01/15/15 1745  BP: 123/80  Pulse: 67  Temp:   Resp:      General:  Patient is nontoxic appearing, in no acute distress, awake and alert oriented  Eyes: Pupils are equal round reactive to light extraocular movement intact  ENT: Neck is supple symmetrical note jugular venous distention, no oral lesions or masses  Cardiovascular: Regular rate and rhythm normal S1-S2 no murmurs rubs or gallops  Respiratory: Normal respiratory effort, lungs are clear to auscultation bilaterally no wheezing rhonchi or rales  Abdomen: Soft nontender nondistended positive bowel sounds  Skin: Patient having multiple track marks over wrist forearm and antecubital region of his left upper extremity and track marks over right antecubital region. On his left upper extremity has mild erythema over the track marks of antecubital region possibly extending proximally. I do not palpate fluctuant masses nor was there tenderness to palpation.  Musculoskeletal: No edema  Psychiatric: Patient is awake and alert oriented 3 appropriate  Neurologic: Nonfocal  Labs on Admission:  Basic Metabolic Panel:  Recent Labs Lab 01/15/15 1254  NA 140  K 3.6  CL 105  CO2 26  GLUCOSE 103*  BUN 8  CREATININE 0.81  CALCIUM 9.7   Liver Function Tests: No results for input(s): AST, ALT, ALKPHOS, BILITOT, PROT, ALBUMIN in the last 168 hours. No results for input(s): LIPASE, AMYLASE in the last 168 hours. No results  for input(s): AMMONIA in the last 168 hours. CBC:  Recent Labs Lab 01/15/15 1254  WBC 4.0  NEUTROABS 2.1  HGB 11.3*  HCT 33.9*  MCV 78.7  PLT 380   Cardiac Enzymes: No results for input(s): CKTOTAL, CKMB, CKMBINDEX, TROPONINI in the last 168 hours. BNP: Invalid input(s): POCBNP CBG: No results for input(s): GLUCAP in the last 168 hours.  Radiological Exams on Admission: Dg Chest 2 View  01/15/2015   CLINICAL DATA:  Shortness of breath for 2 days  EXAM: CHEST  2 VIEW  COMPARISON:  April 20, 2012  FINDINGS: The lungs are clear. The heart size and pulmonary vascularity are normal. No adenopathy. No bone lesions.  IMPRESSION: No abnormality noted.   Electronically Signed   By: Bretta Bang III M.D.   On: 01/15/2015 13:54    EKG: Independently reviewed.   Time spent: 50 minutes  Jeralyn Bennett [facility] Pager [phone]  If 7PM-7AM, please contact night-coverage [email or web address] Password [facility] 01/15/2015, 5:57 PM

## 2015-01-20 LAB — CULTURE, BLOOD (ROUTINE X 2)
CULTURE: NO GROWTH
CULTURE: NO GROWTH

## 2015-03-13 ENCOUNTER — Encounter (INDEPENDENT_AMBULATORY_CARE_PROVIDER_SITE_OTHER): Payer: Self-pay

## 2015-03-13 ENCOUNTER — Ambulatory Visit (INDEPENDENT_AMBULATORY_CARE_PROVIDER_SITE_OTHER): Payer: BLUE CROSS/BLUE SHIELD | Admitting: Family Medicine

## 2015-03-13 ENCOUNTER — Encounter: Payer: Self-pay | Admitting: Family Medicine

## 2015-03-13 ENCOUNTER — Ambulatory Visit (INDEPENDENT_AMBULATORY_CARE_PROVIDER_SITE_OTHER): Payer: BLUE CROSS/BLUE SHIELD

## 2015-03-13 VITALS — BP 114/72 | HR 105 | Temp 97.4°F | Ht 73.0 in | Wt 186.0 lb

## 2015-03-13 DIAGNOSIS — F1910 Other psychoactive substance abuse, uncomplicated: Secondary | ICD-10-CM

## 2015-03-13 DIAGNOSIS — G4700 Insomnia, unspecified: Secondary | ICD-10-CM

## 2015-03-13 DIAGNOSIS — S63257A Unspecified dislocation of left little finger, initial encounter: Secondary | ICD-10-CM

## 2015-03-13 DIAGNOSIS — K2100 Gastro-esophageal reflux disease with esophagitis, without bleeding: Secondary | ICD-10-CM

## 2015-03-13 DIAGNOSIS — L309 Dermatitis, unspecified: Secondary | ICD-10-CM | POA: Diagnosis not present

## 2015-03-13 DIAGNOSIS — S63259A Unspecified dislocation of unspecified finger, initial encounter: Secondary | ICD-10-CM | POA: Diagnosis not present

## 2015-03-13 DIAGNOSIS — M25512 Pain in left shoulder: Secondary | ICD-10-CM | POA: Diagnosis not present

## 2015-03-13 MED ORDER — OMEPRAZOLE 40 MG PO CPDR: 40.0000 mg | DELAYED_RELEASE_CAPSULE | Freq: Every day | ORAL | Status: DC

## 2015-03-13 MED ORDER — PREDNISONE 10 MG PO TABS: ORAL_TABLET | ORAL | Status: DC

## 2015-03-13 MED ORDER — TRAZODONE HCL 150 MG PO TABS: 150.0000 mg | ORAL_TABLET | Freq: Every day | ORAL | Status: DC

## 2015-03-13 NOTE — Progress Notes (Signed)
Subjective:  Patient ID: Rodney Whitaker, male    DOB: 03/10/1991  Age: 24 y.o. MRN: [id number]  CC: Back Pain   HPI Rodney Whitaker presents for Just got out of 28 day tx for opiates. Recently started working out. Left pectoral and posterior shoulder pain. 3/10 today. Was 6-7/10 yesterday. Had done a workout the day before.   Got on pain pills as a result of back injury at work. Went to IV heroin. Seeing Dr. Warrick Parisian  Every other week for suboxone tx. In [place].   Needs hepatitis & HIV bloodwork  After pt left, he returned stating that he had dislocated his left fifth finger when he attempted to fasten his seatbelt. The patient states that it has been dislocated on 5 occasions before. He had been offered surgery by orthopedics in the past. Today he needs to get to work and has missed too much work due to his drug treatment to allow him to be off because of the dislocation.  History Rodney Whitaker has a past medical history of GERD (gastroesophageal reflux disease).   He has no past surgical history on file.   His family history includes Cancer in his father; Healthy in his mother.He reports that he has never smoked. He does not have any smokeless tobacco history on file. He reports that he drinks alcohol. 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  Outpatient Prescriptions Prior to Visit  Medication Sig Dispense Refill  . pantoprazole (PROTONIX) 40 MG tablet Take 40 mg by mouth daily.  0  . cephALEXin (KEFLEX) 500 MG capsule Take 1 capsule (500 mg total) by mouth 4 (four) times daily. (Patient not taking: Reported on 03/13/2015) 20 capsule 0  . ibuprofen (ADVIL) 200 MG tablet Take 200 mg by mouth every 6 (six) hours as needed for headache, mild pain or moderate pain.    Marland Kitchen omeprazole (PRILOSEC OTC) 20 MG tablet Take 20 mg by mouth daily.    . sertraline (ZOLOFT) 50 MG tablet Take 1 tablet (50 mg total) by mouth daily. 1/2 tab daily for the first 6 days and then full tab daily (Patient not  taking: Reported on 01/15/2015) 30 tablet 0  . sulfamethoxazole-trimethoprim (BACTRIM DS,SEPTRA DS) 800-160 MG per tablet Take 1 tablet by mouth 3 (three) times daily. (Patient not taking: Reported on 03/13/2015) 15 tablet 0   No facility-administered medications prior to visit.    ROS Review of Systems  Constitutional: Negative for fever, chills and diaphoresis.  HENT: Negative for congestion, rhinorrhea and sore throat.   Respiratory: Negative for cough, shortness of breath and wheezing.   Cardiovascular: Negative for chest pain.  Gastrointestinal: Negative for nausea, vomiting, abdominal pain, diarrhea, constipation and abdominal distention.  Genitourinary: Negative for dysuria and frequency.  Musculoskeletal: Positive for arthralgias (left posterior shoulder). Negative for joint swelling.  Skin: Negative for rash.  Neurological: Negative for headaches.    Objective:  BP 114/72 mmHg  Pulse 105  Temp(Src) 97.4 F (36.3 C) (Oral)  Ht 6\' 1"  (1.854 m)  Wt 186 lb (84.369 kg)  BMI 24.55 kg/m2  BP Readings from Last 3 Encounters:  03/13/15 114/72  01/15/15 108/67  02/28/14 110/70    Wt Readings from Last 3 Encounters:  03/13/15 186 lb (84.369 kg)  01/15/15 170 lb (77.111 kg)  02/28/14 164 lb (74.39 kg)     Physical Exam  Constitutional: He is oriented to person, place, and time. He appears well-developed and well-nourished. No distress.  HENT:  Head: Normocephalic and  atraumatic.  Right Ear: External ear normal.  Left Ear: External ear normal.  Nose: Nose normal.  Mouth/Throat: Oropharynx is clear and moist.  Eyes: Conjunctivae and EOM are normal. Pupils are equal, round, and reactive to light.  Neck: Normal range of motion. Neck supple. No thyromegaly present.  Cardiovascular: Normal rate, regular rhythm and normal heart sounds.   No murmur heard. Pulmonary/Chest: Effort normal and breath sounds normal. No respiratory distress. He has no wheezes. He has no rales.    Abdominal: Soft. Bowel sounds are normal. He exhibits no distension. There is no tenderness.  Musculoskeletal:  The left fifth finger has a visible dislocation at the PIP posteriorly. X-ray confirms this. With gentle traction and ventral pressure the finger was relocated and repeat x-ray showed minimal subluxation subsequent to flexed traction led to complete relocation. Confirmation x-ray performed post procedure.  Lymphadenopathy:    He has no cervical adenopathy.  Neurological: He is alert and oriented to person, place, and time. He has normal reflexes.  Skin: Skin is warm and dry.  Psychiatric: He has a normal mood and affect. His behavior is normal. Judgment and thought content normal.    No results found for: HGBA1C  Lab Results  Component Value Date   WBC 4.0 01/15/2015   HGB 11.3* 01/15/2015   HCT 33.9* 01/15/2015   PLT 380 01/15/2015   GLUCOSE 103* 01/15/2015   ALT 97* 04/20/2012   AST 94* 04/20/2012   NA 140 01/15/2015   K 3.6 01/15/2015   CL 105 01/15/2015   CREATININE 0.81 01/15/2015   BUN 8 01/15/2015   CO2 26 01/15/2015    Dg Chest 2 View  01/15/2015   CLINICAL DATA:  Shortness of breath for 2 days  EXAM: CHEST  2 VIEW  COMPARISON:  April 20, 2012  FINDINGS: The lungs are clear. The heart size and pulmonary vascularity are normal. No adenopathy. No bone lesions.  IMPRESSION: No abnormality noted.   Electronically Signed   By: Bretta Bang III M.D.   On: 01/15/2015 13:54    Assessment & Plan:   Rodney Whitaker was seen today for back pain.  Diagnoses and all orders for this visit:  Gastroesophageal reflux disease with esophagitis  Pain in joint, shoulder region, left -     Ambulatory referral to Physical Therapy  Eczema  IV drug abuse -     HIV antibody -     Hepatitis C antibody -     Hepatitis B Virus (Profile VI)  Insomnia  Finger dislocation, initial encounter -     DG Finger Little Left -     Ambulatory referral to Orthopedic Surgery  Other  orders -     predniSONE (DELTASONE) 10 MG tablet; Take 5 daily for 3 days followed by 4,3,2 and 1 for 3 days each. -     omeprazole (PRILOSEC) 40 MG capsule; Take 1 capsule (40 mg total) by mouth daily. -     traZODone (DESYREL) 150 MG tablet; Take 1 tablet (150 mg total) by mouth at bedtime.  I have discontinued Mr. Normoyle omeprazole, sertraline, pantoprazole, ibuprofen, sulfamethoxazole-trimethoprim, cephALEXin, and pantoprazole. I am also having him start on predniSONE, omeprazole, and traZODone. Additionally, I am having him maintain his SUBOXONE and methocarbamol.  Meds ordered this encounter  Medications  . SUBOXONE 8-2 MG FILM    Sig: Place 1 tablet under the tongue 2 (two) times daily.    Refill:  0  . DISCONTD: pantoprazole (PROTONIX) 40 MG tablet  Sig: Take 40 mg by mouth 2 (two) times daily.    Refill:  0  . methocarbamol (ROBAXIN) 750 MG tablet    Sig: Take 750 mg by mouth at bedtime.  . predniSONE (DELTASONE) 10 MG tablet    Sig: Take 5 daily for 3 days followed by 4,3,2 and 1 for 3 days each.    Dispense:  45 tablet    Refill:  0  . omeprazole (PRILOSEC) 40 MG capsule    Sig: Take 1 capsule (40 mg total) by mouth daily.    Dispense:  30 capsule    Refill:  3  . traZODone (DESYREL) 150 MG tablet    Sig: Take 1 tablet (150 mg total) by mouth at bedtime.    Dispense:  30 tablet    Refill:  2     Follow-up: Return in about 1 month (around 04/13/2015).  Mechele Claude, M.D.

## 2015-03-17 ENCOUNTER — Telehealth: Payer: Self-pay | Admitting: Family Medicine

## 2015-03-20 NOTE — Telephone Encounter (Signed)
Attempted to contact patient multiple times.  This encounter will now be closed.

## 2015-04-13 ENCOUNTER — Ambulatory Visit: Payer: BLUE CROSS/BLUE SHIELD | Admitting: Family Medicine

## 2015-04-15 ENCOUNTER — Encounter: Payer: Self-pay | Admitting: Family Medicine

## 2015-04-15 ENCOUNTER — Ambulatory Visit (INDEPENDENT_AMBULATORY_CARE_PROVIDER_SITE_OTHER): Payer: BLUE CROSS/BLUE SHIELD | Admitting: Family Medicine

## 2015-04-15 VITALS — BP 96/60 | HR 61 | Temp 97.2°F | Ht 73.0 in | Wt 189.4 lb

## 2015-04-15 DIAGNOSIS — L700 Acne vulgaris: Secondary | ICD-10-CM

## 2015-04-15 DIAGNOSIS — G4700 Insomnia, unspecified: Secondary | ICD-10-CM

## 2015-04-15 DIAGNOSIS — Z0000 Encounter for general adult medical examination without abnormal findings: Secondary | ICD-10-CM | POA: Diagnosis not present

## 2015-04-15 DIAGNOSIS — K219 Gastro-esophageal reflux disease without esophagitis: Secondary | ICD-10-CM

## 2015-04-15 MED ORDER — TRAZODONE HCL 150 MG PO TABS: 150.0000 mg | ORAL_TABLET | Freq: Every day | ORAL | Status: DC

## 2015-04-15 MED ORDER — OMEPRAZOLE 40 MG PO CPDR: 40.0000 mg | DELAYED_RELEASE_CAPSULE | Freq: Every day | ORAL | Status: DC

## 2015-04-15 MED ORDER — ADAPALENE 0.3 % EX GEL: CUTANEOUS | Status: DC

## 2015-04-15 NOTE — Progress Notes (Signed)
Subjective:  Patient ID: Rodney Whitaker, male    DOB: [birth year]  Age: 24 y.o. MRN: [id number]  CC: Annual Exam   HPI Rodney Whitaker presents for CPE. Using subox from clinic. He relates that he is no longer using the illicit drugs.  History Rodney Whitaker has a past medical history of GERD (gastroesophageal reflux disease).   He has no past surgical history on file.   His family history includes Cancer in his father; Healthy in his mother.He reports that he has never smoked. He does not have any smokeless tobacco history on file. He reports that he drinks alcohol. 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  Outpatient Prescriptions Prior to Visit  Medication Sig Dispense Refill  . SUBOXONE 8-2 MG FILM Place 1 tablet under the tongue 2 (two) times daily.  0  . omeprazole (PRILOSEC) 40 MG capsule Take 1 capsule (40 mg total) by mouth daily. 30 capsule 3  . traZODone (DESYREL) 150 MG tablet Take 1 tablet (150 mg total) by mouth at bedtime. 30 tablet 2  . methocarbamol (ROBAXIN) 750 MG tablet Take 750 mg by mouth at bedtime.    . predniSONE (DELTASONE) 10 MG tablet Take 5 daily for 3 days followed by 4,3,2 and 1 for 3 days each. (Patient not taking: Reported on 04/15/2015) 45 tablet 0   No facility-administered medications prior to visit.    ROS Review of Systems  Constitutional: Negative for fever, chills, diaphoresis, activity change, appetite change, fatigue and unexpected weight change.  HENT: Negative for congestion, ear pain, hearing loss, postnasal drip, rhinorrhea, sore throat, tinnitus and trouble swallowing.   Eyes: Negative for photophobia, pain, discharge and redness.  Respiratory: Negative for apnea, cough, choking, chest tightness, shortness of breath, wheezing and stridor.   Cardiovascular: Negative for chest pain, palpitations and leg swelling.  Gastrointestinal: Negative for nausea, vomiting, abdominal pain, diarrhea, constipation, blood in stool and abdominal distention.    Endocrine: Negative for cold intolerance, heat intolerance, polydipsia, polyphagia and polyuria.  Genitourinary: Negative for dysuria, urgency, frequency, hematuria, flank pain, enuresis, difficulty urinating and genital sores.  Musculoskeletal: Negative for joint swelling and arthralgias.  Skin: Negative for color change, rash and wound.       Minimal papular eruption of acne on the forehead and chin and upper back. Patient has used Differin in the past. Would like refill.  Allergic/Immunologic: Negative for immunocompromised state.  Neurological: Negative for dizziness, tremors, seizures, syncope, facial asymmetry, speech difficulty, weakness, light-headedness, numbness and headaches.  Hematological: Does not bruise/bleed easily.  Psychiatric/Behavioral: Positive for sleep disturbance. Negative for suicidal ideas, hallucinations, behavioral problems, confusion, dysphoric mood, decreased concentration and agitation. The patient is not nervous/anxious and is not hyperactive.     Objective:  BP 96/60 mmHg  Pulse 61  Temp(Src) 97.2 F (36.2 C) (Oral)  Ht _0  (1.854 m)  Wt 189 lb 6.4 oz (85.911 kg)  BMI 24.99 kg/m2  BP Readings from Last 3 Encounters:  04/15/15 96/60  03/13/15 114/72  01/15/15 108/67    Wt Readings from Last 3 Encounters:  04/15/15 189 lb 6.4 oz (85.911 kg)  03/13/15 186 lb (84.369 kg)  01/15/15 170 lb (77.111 kg)     Physical Exam  Constitutional: He is oriented to person, place, and time. He appears well-developed and well-nourished.  HENT:  Head: Normocephalic and atraumatic.  Mouth/Throat: Oropharynx is clear and moist.  Eyes: EOM are normal. Pupils are equal, round, and reactive to light.  Neck: Normal range of motion. No  tracheal deviation present. No thyromegaly present.  Cardiovascular: Normal rate, regular rhythm and normal heart sounds.  Exam reveals no gallop and no friction rub.   No murmur heard. Pulmonary/Chest: Breath sounds normal. He has no  wheezes. He has no rales.  Abdominal: Soft. He exhibits no mass. There is no tenderness.  Musculoskeletal: Normal range of motion. He exhibits no edema.  Neurological: He is alert and oriented to person, place, and time.  Skin: Skin is warm and dry.  Psychiatric: He has a normal mood and affect.    No results found for: HGBA1C  Lab Results  Component Value Date   WBC 4.0 01/15/2015   HGB 11.3* 01/15/2015   HCT 33.9* 01/15/2015   PLT 380 01/15/2015   GLUCOSE 103* 01/15/2015   ALT 97* 04/20/2012   AST 94* 04/20/2012   NA 140 01/15/2015   K 3.6 01/15/2015   CL 105 01/15/2015   CREATININE 0.81 01/15/2015   BUN 8 01/15/2015   CO2 26 01/15/2015    Dg Chest 2 View  01/15/2015   CLINICAL DATA:  Shortness of breath for 2 days  EXAM: CHEST  2 VIEW  COMPARISON:  April 20, 2012  FINDINGS: The lungs are clear. The heart size and pulmonary vascularity are normal. No adenopathy. No bone lesions.  IMPRESSION: No abnormality noted.   Electronically Signed   By: Lowella Grip III M.D.   On: 01/15/2015 13:54    Assessment & Plan:   Rodney Whitaker was seen today for annual exam.  Diagnoses and all orders for this visit:  Wellness examination -     CBC with Differential/Platelet -     CMP14+EGFR -     Lipid panel -     HIV antibody -     Hepatitis C Antibody -     Hepatitis B Virus (Profile VI)  Gastroesophageal reflux disease without esophagitis  Insomnia  Acne vulgaris  Other orders -     Adapalene 0.3 % gel; Apply daily at bedtime -     traZODone (DESYREL) 150 MG tablet; Take 1 tablet (150 mg total) by mouth at bedtime. -     omeprazole (PRILOSEC) 40 MG capsule; Take 1 capsule (40 mg total) by mouth daily.   I have discontinued Rodney Whitaker methocarbamol and predniSONE. I am also having him start on Adapalene. Additionally, I am having him maintain his SUBOXONE, traZODone, and omeprazole.  Meds ordered this encounter  Medications  . Adapalene 0.3 % gel    Sig: Apply daily  at bedtime    Dispense:  45 g    Refill:  11  . traZODone (DESYREL) 150 MG tablet    Sig: Take 1 tablet (150 mg total) by mouth at bedtime.    Dispense:  90 tablet    Refill:  3  . omeprazole (PRILOSEC) 40 MG capsule    Sig: Take 1 capsule (40 mg total) by mouth daily.    Dispense:  90 capsule    Refill:  3     Follow-up: Return in about 1 year (around 04/14/2016) for CPE.  Claretta Fraise, M.D.

## 2015-04-16 LAB — CBC WITH DIFFERENTIAL/PLATELET
BASOS ABS: 0 10*3/uL (ref 0.0–0.2)
Basos: 1 %
EOS (ABSOLUTE): 0.3 10*3/uL (ref 0.0–0.4)
Eos: 9 %
HEMATOCRIT: 32.4 % — AB (ref 37.5–51.0)
HEMOGLOBIN: 10.1 g/dL — AB (ref 12.6–17.7)
IMMATURE GRANULOCYTES: 0 %
Immature Grans (Abs): 0 10*3/uL (ref 0.0–0.1)
LYMPHS ABS: 1.2 10*3/uL (ref 0.7–3.1)
Lymphs: 33 %
MCH: 22.1 pg — ABNORMAL LOW (ref 26.6–33.0)
MCHC: 31.2 g/dL — AB (ref 31.5–35.7)
MCV: 71 fL — ABNORMAL LOW (ref 79–97)
MONOCYTES: 14 %
Monocytes Absolute: 0.5 10*3/uL (ref 0.1–0.9)
NEUTROS PCT: 43 %
Neutrophils Absolute: 1.6 10*3/uL (ref 1.4–7.0)
Platelets: 364 10*3/uL (ref 150–379)
RBC: 4.56 x10E6/uL (ref 4.14–5.80)
RDW: 17.6 % — ABNORMAL HIGH (ref 12.3–15.4)
WBC: 3.7 10*3/uL (ref 3.4–10.8)

## 2015-04-16 LAB — CMP14+EGFR
ALBUMIN: 4.4 g/dL (ref 3.5–5.5)
ALK PHOS: 77 IU/L (ref 39–117)
ALT: 75 IU/L — ABNORMAL HIGH (ref 0–44)
AST: 43 IU/L — ABNORMAL HIGH (ref 0–40)
Albumin/Globulin Ratio: 2.2 (ref 1.1–2.5)
BUN / CREAT RATIO: 10 (ref 8–19)
BUN: 7 mg/dL (ref 6–20)
Bilirubin Total: 0.7 mg/dL (ref 0.0–1.2)
CALCIUM: 9.6 mg/dL (ref 8.7–10.2)
CO2: 26 mmol/L (ref 18–29)
CREATININE: 0.71 mg/dL — AB (ref 0.76–1.27)
Chloride: 100 mmol/L (ref 97–108)
GFR calc Af Amer: 150 mL/min/{1.73_m2} (ref >59)
GFR, EST NON AFRICAN AMERICAN: 129 mL/min/{1.73_m2} (ref >59)
GLOBULIN, TOTAL: 2 g/dL (ref 1.5–4.5)
GLUCOSE: 82 mg/dL (ref 65–99)
Potassium: 4.4 mmol/L (ref 3.5–5.2)
Sodium: 140 mmol/L (ref 134–144)
Total Protein: 6.4 g/dL (ref 6.0–8.5)

## 2015-04-16 LAB — HEPATITIS B VIRUS (PROFILE VI)
HEP B E AB: NEGATIVE
Hep B C IgM: NEGATIVE
Hep B Core Total Ab: NEGATIVE
Hep B E Ag: NEGATIVE
Hep B Surface Ab, Qual: REACTIVE
Hepatitis B Surface Ag: NEGATIVE

## 2015-04-16 LAB — LIPID PANEL
CHOL/HDL RATIO: 1.5 ratio (ref 0.0–5.0)
Cholesterol, Total: 117 mg/dL (ref 100–199)
HDL: 77 mg/dL (ref >39)
LDL CALC: 32 mg/dL (ref 0–99)
Triglycerides: 42 mg/dL (ref 0–149)
VLDL CHOLESTEROL CAL: 8 mg/dL (ref 5–40)

## 2015-04-16 LAB — HEPATITIS C ANTIBODY: Hep C Virus Ab: >11 s/co ratio — ABNORMAL HIGH (ref 0.0–0.9)

## 2015-04-16 LAB — HIV ANTIBODY (ROUTINE TESTING W REFLEX): HIV Screen 4th Generation wRfx: NONREACTIVE

## 2015-04-21 ENCOUNTER — Telehealth: Payer: Self-pay

## 2015-04-21 NOTE — Telephone Encounter (Signed)
Insurance prior authorized Adapalene 0.3%

## 2015-04-29 ENCOUNTER — Encounter: Payer: Self-pay | Admitting: *Deleted

## 2015-05-05 ENCOUNTER — Ambulatory Visit (INDEPENDENT_AMBULATORY_CARE_PROVIDER_SITE_OTHER): Payer: BLUE CROSS/BLUE SHIELD | Admitting: Family Medicine

## 2015-05-05 VITALS — BP 97/59 | HR 65 | Temp 97.2°F | Ht 73.0 in | Wt 192.0 lb

## 2015-05-05 DIAGNOSIS — B1920 Unspecified viral hepatitis C without hepatic coma: Secondary | ICD-10-CM

## 2015-05-05 DIAGNOSIS — D649 Anemia, unspecified: Secondary | ICD-10-CM | POA: Insufficient documentation

## 2015-05-05 DIAGNOSIS — D509 Iron deficiency anemia, unspecified: Secondary | ICD-10-CM | POA: Diagnosis not present

## 2015-05-05 MED ORDER — FERROUS FUMARATE 324 (106 FE) MG PO TABS: 1.0000 | ORAL_TABLET | Freq: Two times a day (BID) | ORAL | Status: DC

## 2015-05-05 NOTE — Progress Notes (Signed)
Last IV use was in May of this year. Finished rehab 03/03/2015  No Known Allergies  Outpatient Encounter Prescriptions as of 05/05/2015  Medication Sig  . omeprazole (PRILOSEC) 40 MG capsule Take 1 capsule (40 mg total) by mouth daily.  . SUBOXONE 8-2 MG FILM Place 1 tablet under the tongue 2 (two) times daily.  . traZODone (DESYREL) 150 MG tablet Take 1 tablet (150 mg total) by mouth at bedtime.  . Adapalene 0.3 % gel Apply daily at bedtime (Patient not taking: Reported on 05/05/2015)  . Ferrous Fumarate 324 (106 FE) MG TABS Take 1 tablet by mouth 2 (two) times daily. For low iron anemia   No facility-administered encounter medications on file as of 05/05/2015.    Past Medical History  Diagnosis Date  . GERD (gastroesophageal reflux disease)     No past surgical history on file.  Social History   Social History  . Marital Status: Single    Spouse Name: N/A  . Number of Children: N/A  . Years of Education: N/A   Occupational History  . Not on file.   Social History Main Topics  . Smoking status: Never Smoker   . Smokeless tobacco: Not on file  . Alcohol Use: 0.0 oz/week    0 Standard drinks or equivalent per week     Comment: occasional  . Drug Use: Yes    Special: IV     Comment: opiods, opana   . Sexual Activity: Not on file   Other Topics Concern  . Not on file   Social History Narrative   1. Hepatitis C virus infection without hepatic coma, unspecified chronicity   2. Iron deficiency anemia     Meds ordered this encounter  Medications  . Ferrous Fumarate 324 (106 FE) MG TABS    Sig: Take 1 tablet by mouth 2 (two) times daily. For low iron anemia    Dispense:  60 tablet    Refill:  5    Orders Placed This Encounter  Procedures  . Anemia Profile B  . Hepatitis C Viral RNA NS3 Genotype  . Ambulatory referral to Gastroenterology    Referral Priority:  Routine    Referral Type:  Consultation    Referral Reason:  Specialty Services Required    Number  of Visits Requested:  1    Labs pending  Follow up in 6 mos 25 min was spent in consultation regarding implications of Hep C. Etiology, dx and treatment plan. Pt.s questions were answered to his satisfaction. Referral pending Mechele Claude, MD

## 2015-05-08 LAB — ANEMIA PROFILE B
BASOS: 2 %
Basophils Absolute: 0.1 10*3/uL (ref 0.0–0.2)
EOS (ABSOLUTE): 0.2 10*3/uL (ref 0.0–0.4)
Eos: 6 %
FERRITIN: 7 ng/mL — AB (ref 30–400)
Folate: 11.8 ng/mL (ref >3.0)
HEMATOCRIT: 33.9 % — AB (ref 37.5–51.0)
Hemoglobin: 10.4 g/dL — ABNORMAL LOW (ref 12.6–17.7)
IRON: 36 ug/dL — AB (ref 38–169)
Immature Grans (Abs): 0 10*3/uL (ref 0.0–0.1)
Immature Granulocytes: 0 %
Iron Saturation: 8 % — CL (ref 15–55)
Lymphocytes Absolute: 1.4 10*3/uL (ref 0.7–3.1)
Lymphs: 40 %
MCH: 22 pg — ABNORMAL LOW (ref 26.6–33.0)
MCHC: 30.7 g/dL — ABNORMAL LOW (ref 31.5–35.7)
MCV: 72 fL — AB (ref 79–97)
Monocytes Absolute: 0.6 10*3/uL (ref 0.1–0.9)
Monocytes: 17 %
NEUTROS ABS: 1.2 10*3/uL — AB (ref 1.4–7.0)
Neutrophils: 35 %
Platelets: 325 10*3/uL (ref 150–379)
RBC: 4.72 x10E6/uL (ref 4.14–5.80)
RDW: 17.9 % — AB (ref 12.3–15.4)
Retic Ct Pct: 0.6 % (ref 0.6–2.6)
Total Iron Binding Capacity: 453 ug/dL — ABNORMAL HIGH (ref 250–450)
UIBC: 417 ug/dL — ABNORMAL HIGH (ref 111–343)
VITAMIN B 12: 520 pg/mL (ref 211–946)
WBC: 3.4 10*3/uL (ref 3.4–10.8)

## 2015-05-08 LAB — HEPATITIS C GENOTYPE

## 2015-05-08 LAB — HCV RNA QUANT RFLX ULTRA OR GENOTYP
HCV Quant Baseline: 479000 IU/mL
HCV log10: 5.68 log10 IU/mL

## 2015-05-13 ENCOUNTER — Encounter (INDEPENDENT_AMBULATORY_CARE_PROVIDER_SITE_OTHER): Payer: Self-pay | Admitting: *Deleted

## 2015-05-28 ENCOUNTER — Encounter (INDEPENDENT_AMBULATORY_CARE_PROVIDER_SITE_OTHER): Payer: Self-pay | Admitting: Internal Medicine

## 2015-05-28 ENCOUNTER — Encounter (INDEPENDENT_AMBULATORY_CARE_PROVIDER_SITE_OTHER): Payer: Self-pay | Admitting: *Deleted

## 2015-05-28 ENCOUNTER — Ambulatory Visit (INDEPENDENT_AMBULATORY_CARE_PROVIDER_SITE_OTHER): Payer: BLUE CROSS/BLUE SHIELD | Admitting: Internal Medicine

## 2015-05-28 VITALS — BP 150/64 | HR 72 | Temp 98.6°F | Ht 72.0 in | Wt 186.9 lb

## 2015-05-28 DIAGNOSIS — B1920 Unspecified viral hepatitis C without hepatic coma: Secondary | ICD-10-CM | POA: Diagnosis not present
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
## 2015-05-28 NOTE — Patient Instructions (Signed)
Labs today. OV 3 months.  

## 2015-05-29 LAB — DRUG SCREEN, URINE
AMPHETAMINE SCRN UR: NEGATIVE
BENZODIAZEPINES.: NEGATIVE
Barbiturate Quant, Ur: NEGATIVE
COCAINE METABOLITES: NEGATIVE
Creatinine,U: 288.85 mg/dL
Marijuana Metabolite: NEGATIVE
Methadone: NEGATIVE
OPIATES: NEGATIVE
Phencyclidine (PCP): NEGATIVE
Propoxyphene: NEGATIVE

## 2015-05-29 LAB — HEPATIC FUNCTION PANEL
ALK PHOS: 59 U/L (ref 40–115)
ALT: 46 U/L (ref 9–46)
AST: 30 U/L (ref 10–40)
Albumin: 4.2 g/dL (ref 3.6–5.1)
BILIRUBIN DIRECT: 0.3 mg/dL — AB (ref <=0.2)
BILIRUBIN TOTAL: 1 mg/dL (ref 0.2–1.2)
Indirect Bilirubin: 0.7 mg/dL (ref 0.2–1.2)
Total Protein: 6.6 g/dL (ref 6.1–8.1)

## 2015-05-29 LAB — CBC WITH DIFFERENTIAL/PLATELET
Basophils Absolute: 0.1 10*3/uL (ref 0.0–0.1)
Basophils Relative: 2 % — ABNORMAL HIGH (ref 0–1)
EOS PCT: 8 % — AB (ref 0–5)
Eosinophils Absolute: 0.3 10*3/uL (ref 0.0–0.7)
HEMATOCRIT: 38.5 % — AB (ref 39.0–52.0)
Hemoglobin: 12.2 g/dL — ABNORMAL LOW (ref 13.0–17.0)
LYMPHS ABS: 1.3 10*3/uL (ref 0.7–4.0)
Lymphocytes Relative: 40 % (ref 12–46)
MCH: 24.2 pg — ABNORMAL LOW (ref 26.0–34.0)
MCHC: 31.7 g/dL (ref 30.0–36.0)
MCV: 76.4 fL — AB (ref 78.0–100.0)
MONOS PCT: 10 % (ref 3–12)
MPV: 10.5 fL (ref 8.6–12.4)
Monocytes Absolute: 0.3 10*3/uL (ref 0.1–1.0)
Neutro Abs: 1.3 10*3/uL — ABNORMAL LOW (ref 1.7–7.7)
Neutrophils Relative %: 40 % — ABNORMAL LOW (ref 43–77)
Platelets: 247 10*3/uL (ref 150–400)
RBC: 5.04 MIL/uL (ref 4.22–5.81)
RDW: 23.7 % — ABNORMAL HIGH (ref 11.5–15.5)
WBC: 3.3 10*3/uL — ABNORMAL LOW (ref 4.0–10.5)

## 2015-05-29 LAB — PROTIME-INR
INR: 1 (ref <1.50)
Prothrombin Time: 13.3 seconds (ref 11.6–15.2)

## 2015-06-04 ENCOUNTER — Ambulatory Visit (HOSPITAL_COMMUNITY)
Admission: RE | Admit: 2015-06-04 | Discharge: 2015-06-04 | Disposition: A | Discharge status: Home / Self Care | Payer: BLUE CROSS/BLUE SHIELD | Source: Ambulatory Visit | Attending: Internal Medicine | Admitting: Internal Medicine

## 2015-06-04 ENCOUNTER — Ambulatory Visit (HOSPITAL_COMMUNITY): Admission: RE | Admit: 2015-06-04 | Payer: BLUE CROSS/BLUE SHIELD | Source: Ambulatory Visit

## 2015-06-04 DIAGNOSIS — K219 Gastro-esophageal reflux disease without esophagitis: Secondary | ICD-10-CM | POA: Insufficient documentation

## 2015-06-04 DIAGNOSIS — B1920 Unspecified viral hepatitis C without hepatic coma: Secondary | ICD-10-CM | POA: Insufficient documentation

## 2015-06-12 ENCOUNTER — Telehealth (INDEPENDENT_AMBULATORY_CARE_PROVIDER_SITE_OTHER): Payer: Self-pay | Admitting: Internal Medicine

## 2015-06-12 NOTE — Telephone Encounter (Signed)
error 

## 2015-06-12 NOTE — Telephone Encounter (Signed)
Rodney Whitaker, will treat with Harvoni x 8 weeks.

## 2015-06-17 NOTE — Telephone Encounter (Signed)
The PA process has begun. Once we hear from them we will contact the patient.

## 2015-06-25 ENCOUNTER — Telehealth (INDEPENDENT_AMBULATORY_CARE_PROVIDER_SITE_OTHER): Payer: Self-pay | Admitting: *Deleted

## 2015-06-25 NOTE — Telephone Encounter (Signed)
Patient calls in wanting to make an appt.  Does he need to come now or wait until he starts medication?

## 2015-06-26 NOTE — Telephone Encounter (Signed)
Will see 4 weeks after he starts his medicine. Does not need OV now. I have attempted multiple times to reach him and cannot. I believe the number listed is wrong.

## 2015-06-26 NOTE — Telephone Encounter (Signed)
Rodney Whitaker- Do you want to wait until patient gets started on his medication , then make an appointment?

## 2015-06-26 NOTE — Telephone Encounter (Signed)
Rodney Whitaker has now stated that the patient should keep that appointment. He has been hard to get on the phone , so she wants to see him.

## 2015-06-30 ENCOUNTER — Telehealth (INDEPENDENT_AMBULATORY_CARE_PROVIDER_SITE_OTHER): Payer: Self-pay | Admitting: *Deleted

## 2015-06-30 NOTE — Telephone Encounter (Signed)
Patient was approved for Humira. They have been trying to reach him, discovered that the wrong number was entered. Correct number is [phone]. A correct number has been sent to [employer]. Patient will call and let us know when he starts so that labs and appointment can be made.

## 2015-09-03 ENCOUNTER — Ambulatory Visit (INDEPENDENT_AMBULATORY_CARE_PROVIDER_SITE_OTHER): Payer: BLUE CROSS/BLUE SHIELD | Admitting: Internal Medicine

## 2015-09-04 ENCOUNTER — Encounter (INDEPENDENT_AMBULATORY_CARE_PROVIDER_SITE_OTHER): Payer: Self-pay | Admitting: Internal Medicine

## 2015-09-16 ENCOUNTER — Telehealth (INDEPENDENT_AMBULATORY_CARE_PROVIDER_SITE_OTHER): Payer: Self-pay | Admitting: *Deleted

## 2015-09-16 NOTE — Telephone Encounter (Signed)
Opened in error

## 2015-10-04 ENCOUNTER — Emergency Department (HOSPITAL_COMMUNITY)
Admission: EM | Admit: 2015-10-04 | Discharge: 2015-10-04 | Disposition: A | Discharge status: Home / Self Care | Payer: BLUE CROSS/BLUE SHIELD | Attending: Emergency Medicine | Admitting: Emergency Medicine

## 2015-10-04 ENCOUNTER — Emergency Department (HOSPITAL_COMMUNITY): Payer: BLUE CROSS/BLUE SHIELD

## 2015-10-04 ENCOUNTER — Encounter (HOSPITAL_COMMUNITY): Payer: Self-pay | Admitting: Emergency Medicine

## 2015-10-04 DIAGNOSIS — Y999 Unspecified external cause status: Secondary | ICD-10-CM | POA: Insufficient documentation

## 2015-10-04 DIAGNOSIS — Z791 Long term (current) use of non-steroidal anti-inflammatories (NSAID): Secondary | ICD-10-CM | POA: Insufficient documentation

## 2015-10-04 DIAGNOSIS — F17200 Nicotine dependence, unspecified, uncomplicated: Secondary | ICD-10-CM | POA: Insufficient documentation

## 2015-10-04 DIAGNOSIS — T148XXA Other injury of unspecified body region, initial encounter: Secondary | ICD-10-CM

## 2015-10-04 DIAGNOSIS — Y9339 Activity, other involving climbing, rappelling and jumping off: Secondary | ICD-10-CM | POA: Insufficient documentation

## 2015-10-04 DIAGNOSIS — Y929 Unspecified place or not applicable: Secondary | ICD-10-CM | POA: Insufficient documentation

## 2015-10-04 DIAGNOSIS — S92155A Nondisplaced avulsion fracture (chip fracture) of left talus, initial encounter for closed fracture: Secondary | ICD-10-CM | POA: Insufficient documentation

## 2015-10-04 MED ORDER — HYDROCODONE-ACETAMINOPHEN 5-325 MG PO TABS
1.0000 | ORAL_TABLET | Freq: Once | ORAL | Status: AC
  Administered 2015-10-04: 1 via ORAL
  Filled 2015-10-04: qty 1

## 2015-10-04 MED ORDER — KETOROLAC TROMETHAMINE 60 MG/2ML IM SOLN
60.0000 mg | Freq: Once | INTRAMUSCULAR | Status: AC
  Administered 2015-10-04: 60 mg via INTRAMUSCULAR
  Filled 2015-10-04: qty 2

## 2015-10-04 MED ORDER — HYDROCODONE-ACETAMINOPHEN 5-325 MG PO TABS: 1.0000 | ORAL_TABLET | Freq: Four times a day (QID) | ORAL | Status: DC | PRN

## 2015-10-04 MED ORDER — IBUPROFEN 800 MG PO TABS: 800.0000 mg | ORAL_TABLET | Freq: Three times a day (TID) | ORAL | Status: DC
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
## 2016-03-19 ENCOUNTER — Encounter (HOSPITAL_COMMUNITY): Payer: Self-pay | Admitting: *Deleted

## 2016-03-19 ENCOUNTER — Emergency Department (HOSPITAL_COMMUNITY)
Admission: EM | Admit: 2016-03-19 | Discharge: 2016-03-19 | Disposition: A | Discharge status: Home / Self Care | Payer: Self-pay | Attending: Dermatology | Admitting: Dermatology

## 2016-03-19 DIAGNOSIS — Z5321 Procedure and treatment not carried out due to patient leaving prior to being seen by health care provider: Secondary | ICD-10-CM | POA: Insufficient documentation

## 2016-03-19 DIAGNOSIS — R2231 Localized swelling, mass and lump, right upper limb: Secondary | ICD-10-CM | POA: Insufficient documentation

## 2016-03-19 DIAGNOSIS — Z791 Long term (current) use of non-steroidal anti-inflammatories (NSAID): Secondary | ICD-10-CM | POA: Insufficient documentation

## 2016-03-19 HISTORY — DX: Other psychoactive substance abuse, uncomplicated: F19.10

## 2016-03-19 NOTE — ED Triage Notes (Signed)
Pt reports that he is an IV drug user and yesterday, he missed the vein. Pt has redness and swelling to his right upper forearm. Pt also c/o sore throat.

## 2016-03-19 NOTE — ED Notes (Signed)
Pt states he must leave at this time, can no longer wait for the physician. Patient signed out AMA at this time

## 2016-04-30 ENCOUNTER — Emergency Department (HOSPITAL_COMMUNITY)
Admission: EM | Admit: 2016-04-30 | Discharge: 2016-04-30 | Disposition: A | Discharge status: Home / Self Care | Payer: Self-pay | Attending: Emergency Medicine | Admitting: Emergency Medicine

## 2016-04-30 ENCOUNTER — Encounter (HOSPITAL_COMMUNITY): Payer: Self-pay | Admitting: Emergency Medicine

## 2016-04-30 ENCOUNTER — Encounter (HOSPITAL_COMMUNITY): Payer: Self-pay | Admitting: *Deleted

## 2016-04-30 DIAGNOSIS — F1510 Other stimulant abuse, uncomplicated: Secondary | ICD-10-CM

## 2016-04-30 DIAGNOSIS — F17220 Nicotine dependence, chewing tobacco, uncomplicated: Secondary | ICD-10-CM | POA: Insufficient documentation

## 2016-04-30 DIAGNOSIS — F1910 Other psychoactive substance abuse, uncomplicated: Secondary | ICD-10-CM | POA: Insufficient documentation

## 2016-04-30 DIAGNOSIS — R443 Hallucinations, unspecified: Secondary | ICD-10-CM

## 2016-04-30 MED ORDER — CLONIDINE HCL 0.1 MG PO TABS
0.1000 mg | ORAL_TABLET | Freq: Once | ORAL | Status: AC
  Administered 2016-04-30: 0.1 mg via ORAL
  Filled 2016-04-30: qty 1

## 2016-04-30 MED ORDER — CHLORDIAZEPOXIDE HCL 25 MG PO CAPS
ORAL_CAPSULE | ORAL | 0 refills | Status: DC
Start: 2016-04-30 — End: 2016-06-14

## 2016-04-30 NOTE — ED Triage Notes (Signed)
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

## 2016-04-30 NOTE — ED Provider Notes (Signed)
[facility]-EM[facility]Y DEPT Provider Note   CSN: [id number] Arrival date & time: 04/30/16  1537     History   Chief Complaint Chief Complaint  Patient presents with  . Drug Problem  . Medical Clearance    HPI Rodney Whitaker is a 25 y.o. male.  25 yo M with a chief complaints of needing to stop using methamphetamines. This been going on for many weeks the patient is now 4 this looking to get a new job. On review of systems patient stated that he had been having some hand and foot swelling as well as some recurrent nosebleeds. Denies SI or HI.   The history is provided by the patient.  Drug Problem  This is a recurrent problem. The current episode started more than 1 week ago. The problem occurs constantly. The problem has not changed since onset.Pertinent negatives include no chest pain, no abdominal pain, no headaches and no shortness of breath. Nothing aggravates the symptoms. Nothing relieves the symptoms. He has tried nothing for the symptoms. The treatment provided no relief.    Past Medical History:  Diagnosis Date  . GERD (gastroesophageal reflux disease)   . Hepatitis C   . IV drug abuse     Patient Active Problem List   Diagnosis Date Noted  . Hepatitis C 05/05/2015  . Anemia 05/05/2015  . Phlebitis 01/15/2015  . GERD (gastroesophageal reflux disease) 12/11/2012    History reviewed. No pertinent surgical history.     Home Medications    Prior to Admission medications   Medication Sig Start Date End Date Taking? Authorizing Provider  chlordiazePOXIDE (LIBRIUM) 25 MG capsule 50mg  PO TID x 1D, then 25-50mg  PO BID X 1D, then 25-50mg  PO QD X 1D 04/30/16   Melene Planan Nikolaj Geraghty, DO  Ferrous Fumarate 324 (106 FE) MG TABS Take 1 tablet by mouth 2 (two) times daily. For low iron anemia 05/05/15 11/03/15  Mechele ClaudeWarren Stacks, MD  HYDROcodone-acetaminophen (NORCO/VICODIN) 5-325 MG tablet Take 1-2 tablets by mouth every 6 (six) hours as needed for moderate pain. 10/04/15   Marily MemosJason Mesner, MD    ibuprofen (ADVIL,MOTRIN) 800 MG tablet Take 1 tablet (800 mg total) by mouth 3 (three) times daily. 10/04/15   Marily MemosJason Mesner, MD  omeprazole (PRILOSEC) 40 MG capsule Take 1 capsule (40 mg total) by mouth daily. 04/15/15   Mechele ClaudeWarren Stacks, MD    Family History Family History  Problem Relation Age of Onset  . Healthy Mother   . Cancer Father     lung cancer    Social History Social History  Substance Use Topics  . Smoking status: Never Smoker  . Smokeless tobacco: Current User    Types: Chew  . Alcohol use No     Comment:       Allergies   Review of patient's allergies indicates no known allergies.   Review of Systems Review of Systems  Constitutional: Negative for chills and fever.  HENT: Negative for congestion and facial swelling.   Eyes: Negative for discharge and visual disturbance.  Respiratory: Negative for shortness of breath.   Cardiovascular: Positive for palpitations. Negative for chest pain.  Gastrointestinal: Negative for abdominal pain, diarrhea and vomiting.  Musculoskeletal: Negative for arthralgias and myalgias.  Skin: Negative for color change and rash.  Neurological: Positive for weakness. Negative for tremors, syncope and headaches.  Psychiatric/Behavioral: Negative for confusion and dysphoric mood.     Physical Exam Updated Vital Signs BP 142/90 (BP Location: Left Arm)   Pulse 118   Temp 98.7  F (37.1 C) (Oral)   Resp 18   Ht 6' (1.829 m)   Wt 170 lb (77.1 kg)   SpO2 97%   BMI 23.06 kg/m   Physical Exam  Constitutional: He is oriented to person, place, and time. He appears well-developed and well-nourished.  HENT:  Head: Normocephalic and atraumatic.  Eyes: EOM are normal. Pupils are equal, round, and reactive to light.  Neck: Normal range of motion. Neck supple. No JVD present.  Cardiovascular: Regular rhythm.  Tachycardia present.  Exam reveals no gallop and no friction rub.   No murmur heard. Pulmonary/Chest: No respiratory distress.  He has no wheezes.  Abdominal: He exhibits no distension and no mass. There is no tenderness. There is no rebound and no guarding.  Musculoskeletal: Normal range of motion.  Neurological: He is alert and oriented to person, place, and time.  Skin: No rash noted. No pallor.  Psychiatric: He has a normal mood and affect. His behavior is normal.  Nursing note and vitals reviewed.    ED Treatments / Results  Labs (all labs ordered are listed, but only abnormal results are displayed) Labs Reviewed - No data to display  EKG  EKG Interpretation None       Radiology No results found.  Procedures Procedures (including critical care time)  Medications Ordered in ED Medications  cloNIDine (CATAPRES) tablet 0.1 mg (not administered)     Initial Impression / Assessment and Plan / ED Course  I have reviewed the triage vital signs and the nursing notes.  Pertinent labs & imaging results that were available during my care of the patient were reviewed by me and considered in my medical decision making (see chart for details).  Clinical Course    25 yo M With a chief complaint of request for rehabilitation. Discussed that this is no longer offered as an inpatient. Given a list of resources. Discharge home.  4:18 PM:  I have discussed the diagnosis/risks/treatment options with the family and believe the pt to be eligible for discharge home to follow-up with Rehab facilities. We also discussed returning to the ED immediately if new or worsening sx occur. We discussed the sx which are most concerning (e.g., SI, HI) that necessitate immediate return. Medications administered to the patient during their visit and any new prescriptions provided to the patient are listed below.  Medications given during this visit Medications  cloNIDine (CATAPRES) tablet 0.1 mg (not administered)     The patient appears reasonably screen and/or stabilized for discharge and I doubt any other medical  condition or other [facility] requiring further screening, evaluation, or treatment in the ED at this time prior to discharge.    Final Clinical Impressions(s) / ED Diagnoses   Final diagnoses:  Polysubstance abuse    New Prescriptions New Prescriptions   CHLORDIAZEPOXIDE (LIBRIUM) 25 MG CAPSULE    50mg  PO TID x 1D, then 25-50mg  PO BID X 1D, then 25-50mg  PO QD X 1D     Melene Plan, DO 04/30/16 1618

## 2016-04-30 NOTE — ED Notes (Signed)
Pt assessed and Dr. Adela LankFloyd entered room.  Pt had told triage nurse he was going to harm self, but denies to myself and Dr. Adela LankFloyd.

## 2016-04-30 NOTE — ED Notes (Signed)
Pt denies pain,SI/HI,voices or delusions at time of discharge. nad noted. Pt given resources for outpt follow-up with discharge instructions.

## 2016-04-30 NOTE — Discharge Instructions (Signed)
Follow up with your family doc and discuss your symptoms

## 2016-04-30 NOTE — ED Triage Notes (Signed)
Pt reports his last drug use was at approx 1000 this am.

## 2016-04-30 NOTE — ED Triage Notes (Signed)
Now pt is stating he can't feel his legs or his left arm x 1 week and has gotten worse today.

## 2016-04-30 NOTE — ED Triage Notes (Addendum)
Pt states he has had a sexual encounter with someone who has HIV and has had hep C. Pt states he has been bleeding from his mouth, nose and ears. Pt feels like he has a "bacteria" all over his body. Pt states his head and neck are aching and sore. Pt is an IV drug user and used meth this morning. Pt wanting to be tested for HIV and Hep C and c/o sores in his mouth.  Pt states he needs to speak with someone. Pt states he can not go "back out there."  Pt states he is not suicidal. Pt states he has been seeing bacteria all over his body.  Pt states he wants help getting off meth.   Pt tried to say something about his dad but would not elaborate.

## 2016-04-30 NOTE — ED Provider Notes (Addendum)
[facility] DE[facility] Provider Note   CSN: [id number] Arrival date & time: 04/30/16  7829  By signing my name below, I, Rodney Whitaker, attest that this documentation has been prepared under the direction and in the presence of . Electronically Signed: Bridgette Whitaker, ED Scribe. 04/30/16. 9:41 PM.  History   Chief Complaint Chief Complaint  Patient presents with  . Hallucinations   HPI Comments: Rodney Whitaker is a 25 y.o. male with h/o IV drug abuse who presents to the Emergency Department for hallucinations onset "a couple years" ago, worsening this week. Pt notes that he hears noises which he tries to ignore and that he has been seeing bacteria all over his body. He notes that his hallucinations seem to be exacerbated when he is using methamphetamines. Pt is a regular IV drug user and notes that his last use was this morning.  Pt also states he has been having bleeding from his mouth, nose, and ears. He reports that he has recently had a sexual encounter with someone who has HIV and has had Hepatitis C. Pt wants to be tested for HIV and Hep C. He denies SI.   The history is provided by the patient. No language interpreter was used.    Past Medical History:  Diagnosis Date  . GERD (gastroesophageal reflux disease)   . Hepatitis C   . IV drug abuse     Patient Active Problem List   Diagnosis Date Noted  . Hepatitis C 05/05/2015  . Anemia 05/05/2015  . Phlebitis 01/15/2015  . GERD (gastroesophageal reflux disease) 12/11/2012    History reviewed. No pertinent surgical history.     Home Medications    Prior to Admission medications   Medication Sig Start Date End Date Taking? Authorizing Provider  chlordiazePOXIDE (LIBRIUM) 25 MG capsule 50mg  PO TID x 1D, then 25-50mg  PO BID X 1D, then 25-50mg  PO QD X 1D 04/30/16   Melene Plan, DO  Ferrous Fumarate 324 (106 FE) MG TABS Take 1 tablet by mouth 2 (two) times daily. For low iron anemia 05/05/15 11/03/15  Mechele Claude, MD    HYDROcodone-acetaminophen (NORCO/VICODIN) 5-325 MG tablet Take 1-2 tablets by mouth every 6 (six) hours as needed for moderate pain. 10/04/15   Marily Memos, MD  ibuprofen (ADVIL,MOTRIN) 800 MG tablet Take 1 tablet (800 mg total) by mouth 3 (three) times daily. 10/04/15   Marily Memos, MD  omeprazole (PRILOSEC) 40 MG capsule Take 1 capsule (40 mg total) by mouth daily. 04/15/15   Mechele Claude, MD    Family History Family History  Problem Relation Age of Onset  . Healthy Mother   . Cancer Father     lung cancer    Social History Social History  Substance Use Topics  . Smoking status: Never Smoker  . Smokeless tobacco: Current User    Types: Chew  . Alcohol use No     Comment:       Allergies   Review of patient's allergies indicates no known allergies.   Review of Systems Review of Systems  Constitutional: Negative for chills and fever.  HENT: Negative for congestion and facial swelling.   Eyes: Negative for discharge and visual disturbance.  Respiratory: Negative for shortness of breath.   Cardiovascular: Negative for chest pain and palpitations.  Gastrointestinal: Negative for abdominal pain, diarrhea and vomiting.  Musculoskeletal: Negative for arthralgias and myalgias.  Skin: Negative for color change and rash.  Neurological: Negative for tremors, syncope and headaches.  Psychiatric/Behavioral: Positive for hallucinations. Negative  for confusion, dysphoric mood and suicidal ideas.  All other systems reviewed and are negative.    Physical Exam Updated Vital Signs BP 121/80 (BP Location: Right Arm)   Pulse 94   Temp 98.2 F (36.8 C) (Oral)   Resp 18   Ht 6' (1.829 m)   Wt 170 lb (77.1 kg)   SpO2 100%   BMI 23.06 kg/m   Physical Exam  Constitutional: He is oriented to person, place, and time. He appears well-developed and well-nourished.  HENT:  Head: Normocephalic and atraumatic.  Eyes: EOM are normal. Pupils are equal, round, and reactive to light.   Neck: Normal range of motion. Neck supple. No JVD present.  Cardiovascular: Normal rate and regular rhythm.  Exam reveals no gallop and no friction rub.   No murmur heard. Pulmonary/Chest: No respiratory distress. He has no wheezes.  Abdominal: He exhibits no distension. There is no rebound and no guarding.  Musculoskeletal: Normal range of motion.  Neurological: He is alert and oriented to person, place, and time.  Skin: No rash noted. No pallor.  Psychiatric: His behavior is normal.  Flat affect. Pt does not appear to be actively hallucinating.  Nursing note and vitals reviewed.    ED Treatments / Results  DIAGNOSTIC STUDIES: Oxygen Saturation is 100% on RA, normal by my interpretation.    COORDINATION OF CARE: 9:40 PM Discussed treatment plan with pt at bedside which includes bloodwork and pt agreed to plan.  Labs (all labs ordered are listed, but only abnormal results are displayed) Labs Reviewed  RAPID HIV SCREEN (HIV 1/2 AB+AG)  HEPATITIS PANEL, ACUTE    EKG  EKG Interpretation None       Radiology No results found.  Procedures Procedures (including critical care time)  Medications Ordered in ED Medications - No data to display   Initial Impression / Assessment and Plan / ED Course  I have reviewed the triage vital signs and the nursing notes.  Pertinent labs & imaging results that were available during my care of the patient were reviewed by me and considered in my medical decision making (see chart for details).  Clinical Course    25 yo M With a chief complaint of hallucinations. Appeared to occur when he is doing illegal drugs. Discussed with the patient that he needs to follow-up with an outpatient. Patient also concerned that he may have had contact With Hepatitis and HIV. Discussed That We'll Draw These Tests. PCP Follow-Up. I asked the patient if there is any other reason why he returned, he said he was unable to contact with anyone for a ride  home.  11:54 PM:  I have discussed the diagnosis/risks/treatment options with the patient and family and believe the pt to be eligible for discharge home to follow-up with PCP. We also discussed returning to the ED immediately if new or worsening sx occur. We discussed the sx which are most concerning (e.g., sudden worsening pain, fever, inability to tolerate by mouth) that necessitate immediate return. Medications administered to the patient during their visit and any new prescriptions provided to the patient are listed below.  Medications given during this visit Medications - No data to display   The patient appears reasonably screen and/or stabilized for discharge and I doubt any other medical condition or other [facility] requiring further screening, evaluation, or treatment in the ED at this time prior to discharge.    Final Clinical Impressions(s) / ED Diagnoses   Final diagnoses:  Hallucinations  Amphetamine abuse  I personally performed the services described in this documentation, which was scribed in my presence. The recorded information has been reviewed and is accurate.   New Prescriptions Discharge Medication List as of 04/30/2016 11:11 PM       Melene Planan Honest Safranek, DO 04/30/16 2354    Melene Planan Thelbert Gartin, DO 04/30/16 2354

## 2016-05-01 LAB — RAPID HIV SCREEN (HIV 1/2 AB+AG)
HIV 1/2 Antibodies: NONREACTIVE
HIV-1 P24 Antigen - HIV24: NONREACTIVE

## 2016-05-03 LAB — HEPATITIS PANEL, ACUTE
HCV Ab: >11 s/co ratio — ABNORMAL HIGH (ref 0.0–0.9)
HEP A IGM: NEGATIVE
Hep B C IgM: NEGATIVE
Hepatitis B Surface Ag: NEGATIVE

## 2016-05-04 ENCOUNTER — Telehealth (HOSPITAL_BASED_OUTPATIENT_CLINIC_OR_DEPARTMENT_OTHER): Payer: Self-pay

## 2016-05-04 NOTE — Telephone Encounter (Signed)
+   HCV > 11.0 faxed to Dr. Darlyn ReadStacks office at [phone]

## 2016-06-14 ENCOUNTER — Emergency Department (HOSPITAL_COMMUNITY): Payer: Self-pay

## 2016-06-14 ENCOUNTER — Encounter (HOSPITAL_COMMUNITY): Payer: Self-pay | Admitting: Emergency Medicine

## 2016-06-14 ENCOUNTER — Emergency Department (HOSPITAL_COMMUNITY)
Admission: EM | Admit: 2016-06-14 | Discharge: 2016-06-14 | Disposition: A | Discharge status: Home / Self Care | Payer: Self-pay | Attending: Emergency Medicine | Admitting: Emergency Medicine

## 2016-06-14 DIAGNOSIS — F17220 Nicotine dependence, chewing tobacco, uncomplicated: Secondary | ICD-10-CM | POA: Insufficient documentation

## 2016-06-14 DIAGNOSIS — Z79899 Other long term (current) drug therapy: Secondary | ICD-10-CM | POA: Insufficient documentation

## 2016-06-14 DIAGNOSIS — R0789 Other chest pain: Secondary | ICD-10-CM | POA: Insufficient documentation

## 2016-06-14 LAB — CBC WITH DIFFERENTIAL/PLATELET
BASOS ABS: 0 10*3/uL (ref 0.0–0.1)
BASOS PCT: 1 %
Eosinophils Absolute: 0 10*3/uL (ref 0.0–0.7)
Eosinophils Relative: 1 %
HEMATOCRIT: 40.8 % (ref 39.0–52.0)
HEMOGLOBIN: 14.4 g/dL (ref 13.0–17.0)
LYMPHS PCT: 42 %
Lymphs Abs: 1.5 10*3/uL (ref 0.7–4.0)
MCH: 29.8 pg (ref 26.0–34.0)
MCHC: 35.3 g/dL (ref 30.0–36.0)
MCV: 84.5 fL (ref 78.0–100.0)
MONO ABS: 0.7 10*3/uL (ref 0.1–1.0)
Monocytes Relative: 18 %
NEUTROS ABS: 1.4 10*3/uL — AB (ref 1.7–7.7)
Neutrophils Relative %: 38 %
Platelets: 194 10*3/uL (ref 150–400)
RBC: 4.83 MIL/uL (ref 4.22–5.81)
RDW: 12.4 % (ref 11.5–15.5)
WBC: 3.6 10*3/uL — AB (ref 4.0–10.5)

## 2016-06-14 LAB — PROTIME-INR
INR: 0.94
Prothrombin Time: 12.5 seconds (ref 11.4–15.2)

## 2016-06-14 LAB — COMPREHENSIVE METABOLIC PANEL
ALBUMIN: 4.1 g/dL (ref 3.5–5.0)
ALK PHOS: 57 U/L (ref 38–126)
ALT: 49 U/L (ref 17–63)
ANION GAP: 5 (ref 5–15)
AST: 30 U/L (ref 15–41)
BILIRUBIN TOTAL: 1 mg/dL (ref 0.3–1.2)
BUN: 7 mg/dL (ref 6–20)
CO2: 32 mmol/L (ref 22–32)
Calcium: 9.2 mg/dL (ref 8.9–10.3)
Chloride: 103 mmol/L (ref 101–111)
Creatinine, Ser: 0.61 mg/dL (ref 0.61–1.24)
GFR calc Af Amer: >60 mL/min (ref >60)
GFR calc non Af Amer: >60 mL/min (ref >60)
GLUCOSE: 83 mg/dL (ref 65–99)
POTASSIUM: 3.6 mmol/L (ref 3.5–5.1)
SODIUM: 140 mmol/L (ref 135–145)
TOTAL PROTEIN: 7.1 g/dL (ref 6.5–8.1)

## 2016-06-14 LAB — RAPID HIV SCREEN (HIV 1/2 AB+AG)
HIV 1/2 ANTIBODIES: NONREACTIVE
HIV-1 P24 ANTIGEN - HIV24: NONREACTIVE

## 2016-06-14 LAB — APTT: aPTT: 29 seconds (ref 24–36)

## 2016-06-14 LAB — I-STAT TROPONIN, ED: TROPONIN I, POC: 0 ng/mL (ref 0.00–0.08)

## 2016-06-14 NOTE — ED Triage Notes (Signed)
Pt reports using IV methamphetamine 4 days ago and has chronic issues such as neck pain, swelling of legs and feet, difficulty swallowing, mouth pain.  Pt also has hep C.  Pt comes in today due to cp, difficulty swallowing, ringing in ears, no appetite, feels pale.  Pt does not feel like he is withdrawing.

## 2016-06-14 NOTE — ED Provider Notes (Signed)
[facility] DE[facility] Provider Note   CSN: [id number] Arrival date & time: 06/14/16  1835     History   Chief Complaint Chief Complaint  Patient presents with  . Chest Pain    HPI Rodney Whitaker is a 25 y.o. male.  HPI  25 year old male who presents with chest pain. History of IV drug use and hepatitis C. States that he has been having chronic problems of intermittent leg swelling, chronic neck and back pain, oral pain, and felt that this is been secondary to his drug use. States that he has cut back significantly, but still using IV methamphetamine and IV heroin intermittently. States that he wanted to come to the ED to be checked out as he still feels bad. Last use was 4 days ago. Last night while at rest noted brief seconds of sharp chest pains bilaterally. Symptoms now resolved. Denies any fevers, night sweats, weight loss, rash or lesions, vomiting, nausea, diarrhea, abdominal pain.   Past Medical History:  Diagnosis Date  . GERD (gastroesophageal reflux disease)   . Hepatitis C   . IV drug abuse     Patient Active Problem List   Diagnosis Date Noted  . Hepatitis C 05/05/2015  . Anemia 05/05/2015  . Phlebitis 01/15/2015  . GERD (gastroesophageal reflux disease) 12/11/2012    History reviewed. No pertinent surgical history.     Home Medications    Prior to Admission medications   Medication Sig Start Date End Date Taking? Authorizing Provider  milk thistle 175 MG tablet Take 175 mg by mouth daily.   Yes Historical Provider, MD  Multiple Vitamin (MULTIVITAMIN WITH MINERALS) TABS tablet Take 1 tablet by mouth daily.   Yes Historical Provider, MD  Nutritional Supplements (DHEA PO) Take 1 tablet by mouth daily.   Yes Historical Provider, MD  Omega-3 Fatty Acids (OMEGA-3 FISH OIL PO) Take 2 capsules by mouth daily.   Yes Historical Provider, MD    Family History Family History  Problem Relation Age of Onset  . Healthy Mother   . Cancer Father     lung cancer     Social History Social History  Substance Use Topics  . Smoking status: Never Smoker  . Smokeless tobacco: Current User    Types: Chew  . Alcohol use No     Comment:       Allergies   Patient has no known allergies.   Review of Systems Review of Systems 10/14 systems reviewed and are negative other than those stated in the HPI   Physical Exam Updated Vital Signs BP 118/88 (BP Location: Left Arm)   Pulse 95   Temp 98.3 F (36.8 C) (Oral)   Resp 18   Ht 6' (1.829 m)   Wt 180 lb (81.6 kg)   SpO2 100%   BMI 24.41 kg/m   Physical Exam Physical Exam  Nursing note and vitals reviewed. Constitutional:  non-toxic, and in no acute distress Head: Normocephalic and atraumatic.  Mouth/Throat: Oropharynx is clear and moist.  Neck: Normal range of motion. Neck supple.  Cardiovascular: Normal rate and regular rhythm.   Pulmonary/Chest: Effort normal and breath sounds normal.  Abdominal: Soft. There is no tenderness. There is no rebound and no guarding.  Musculoskeletal: Normal range of motion. No edema. Neurological: Alert, no facial droop, fluent speech, moves all extremities symmetrically Skin: Skin is warm and dry.  Psychiatric: Cooperative    ED Treatments / Results  Labs (all labs ordered are listed, but only abnormal results  are displayed) Labs Reviewed  CBC WITH DIFFERENTIAL/PLATELET - Abnormal; Notable for the following:       Result Value   WBC 3.6 (*)    Neutro Abs 1.4 (*)    All other components within normal limits  COMPREHENSIVE METABOLIC PANEL  APTT  PROTIME-INR  RAPID HIV SCREEN (HIV 1/2 AB+AG)  I-STAT TROPOININ, ED    EKG  EKG Interpretation  Date/Time:  Tuesday June 14 2016 18:51:14 EST Ventricular Rate:  86 PR Interval:  134 QRS Duration: 92 QT Interval:  370 QTC Calculation: 442 R Axis:   79 Text Interpretation:  Normal sinus rhythm Nonspecific T wave abnormality Abnormal ECG Since last tracing Nonspecific T wave abnormality now  evident in Inferior leads Confirmed by Effie ShyWENTZ  MD, ELLIOTT [phone]([id number]) on 06/14/2016 7:05:11 PM       Radiology Dg Chest 2 View  Result Date: 06/14/2016 CLINICAL DATA:  Left-sided chest pain with intermittent tightness EXAM: CHEST  2 VIEW COMPARISON:  01/15/2015 FINDINGS: The heart size and mediastinal contours are within normal limits. Both lungs are clear. The visualized skeletal structures are unremarkable. IMPRESSION: No active cardiopulmonary disease. Electronically Signed   By: Jasmine PangKim  Fujinaga M.D.   On: 06/14/2016 20:34    Procedures Procedures (including critical care time)  Medications Ordered in ED Medications - No data to display   Initial Impression / Assessment and Plan / ED Course  I have reviewed the triage vital signs and the nursing notes.  Pertinent labs & imaging results that were available during my care of the patient were reviewed by me and considered in my medical decision making (see chart for details).  Clinical Course     25 year old male who presents with chest pain. History of his chest pain seems atypical, and currently asymptomatic. Afebrile, with reassuring vital signs. Blood work with stable mild leukopenia. There is no major metabolic or electrolyte derangements. Liver function testing are within normal limits. Rapid HIV is negative. There is no stigmata of endocarditis on exam, and history and presentation is currently not concerning for that. CXR is visualized showing no major acute cardiopulmonary processes. Troponin is negative, and presentation seems atypical for that of ACS. No concerns for PE or dissection either based on history and exam.  The patient appears reasonably screened and/or stabilized for discharge and I doubt any other medical condition or other [facility] requiring further screening, evaluation, or treatment in the ED at this time prior to discharge.  Strict return and follow-up instructions reviewed. he expressed understanding of all discharge  instructions and felt comfortable with the plan of care.   Final Clinical Impressions(s) / ED Diagnoses   Final diagnoses:  Atypical chest pain    New Prescriptions New Prescriptions   No medications on file     Lavera Guiseana Duo Avanelle Pixley, MD 06/14/16 2117

## 2016-06-14 NOTE — Discharge Instructions (Signed)
Your blood work and chest x-ray are reassuring today.   Please follow-up as needed with your primary care doctor.   Please return without fail for worsening symptoms, including worsening pain, difficulty breathing, passing out, fevers or night sweats, or any other symptoms concerning to you.

## 2016-06-14 NOTE — ED Notes (Signed)
EKG given to Dr. Wentz 

## 2016-10-31 IMAGING — CR DG FINGER LITTLE 2+V*L*
3 series · 3 of 3 positions shown · non-contrast
Comparison: None.

CLINICAL DATA: Fifth finger dislocation

EXAM:
LEFT LITTLE FINGER 2+V

[view not recorded (1 of 3)]
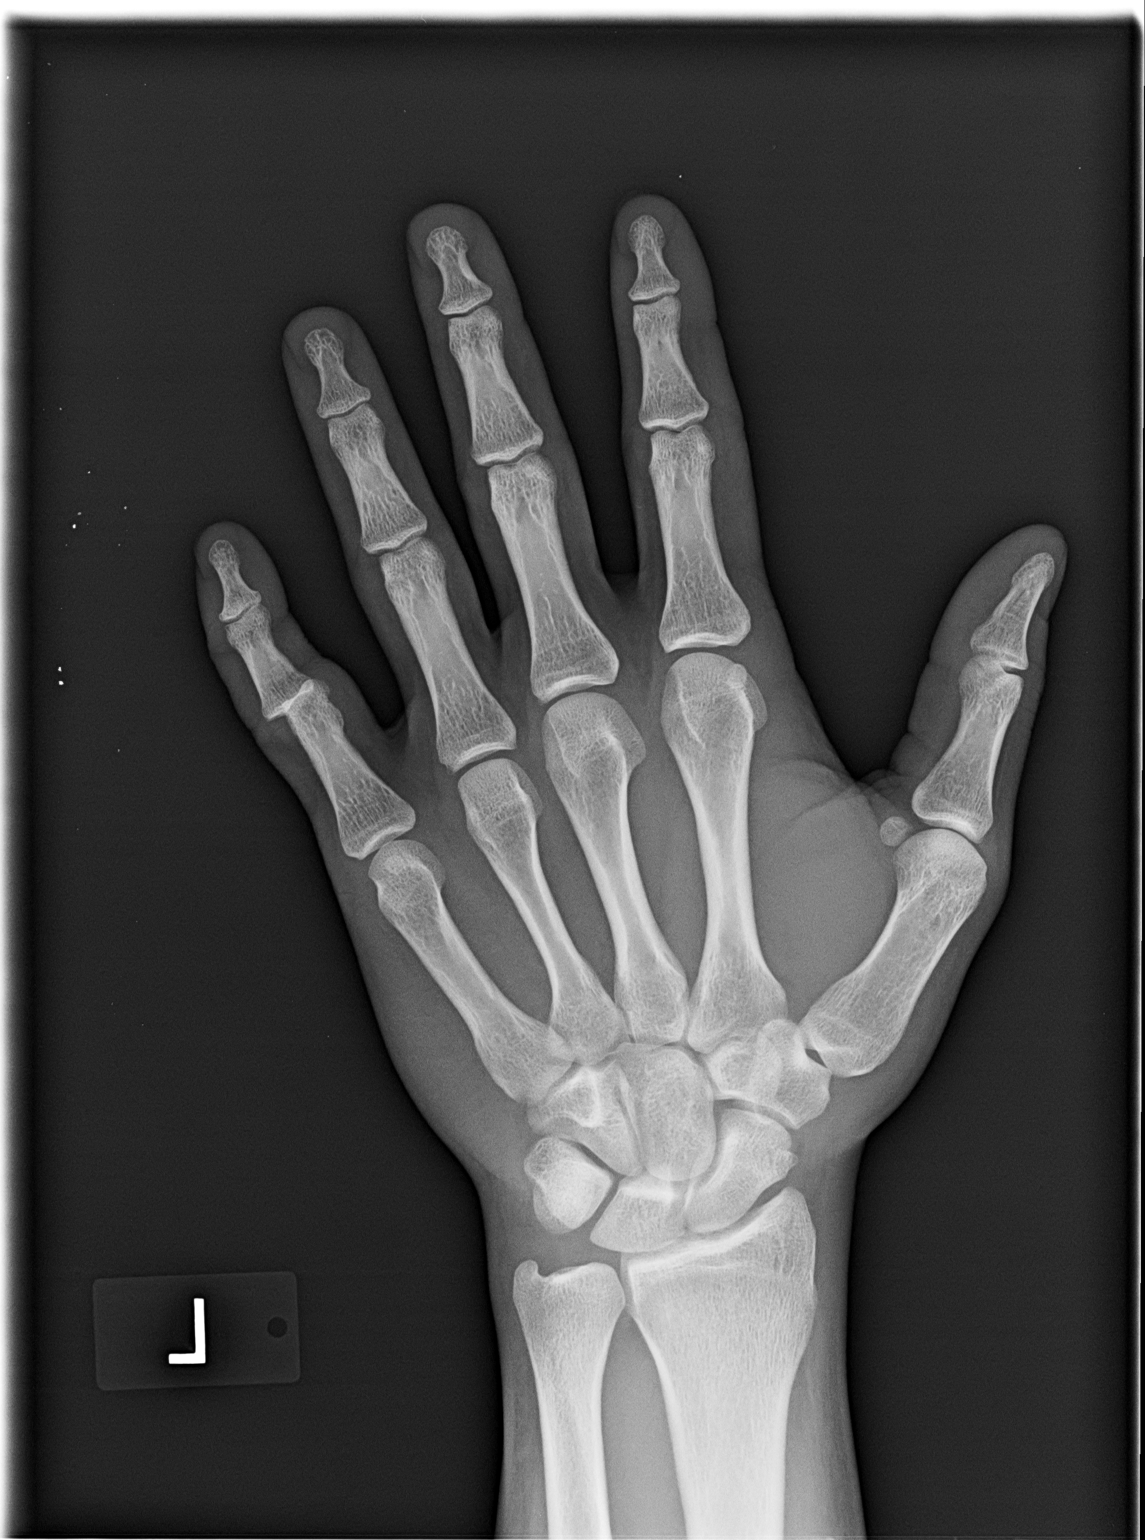

[view not recorded (2 of 3)]
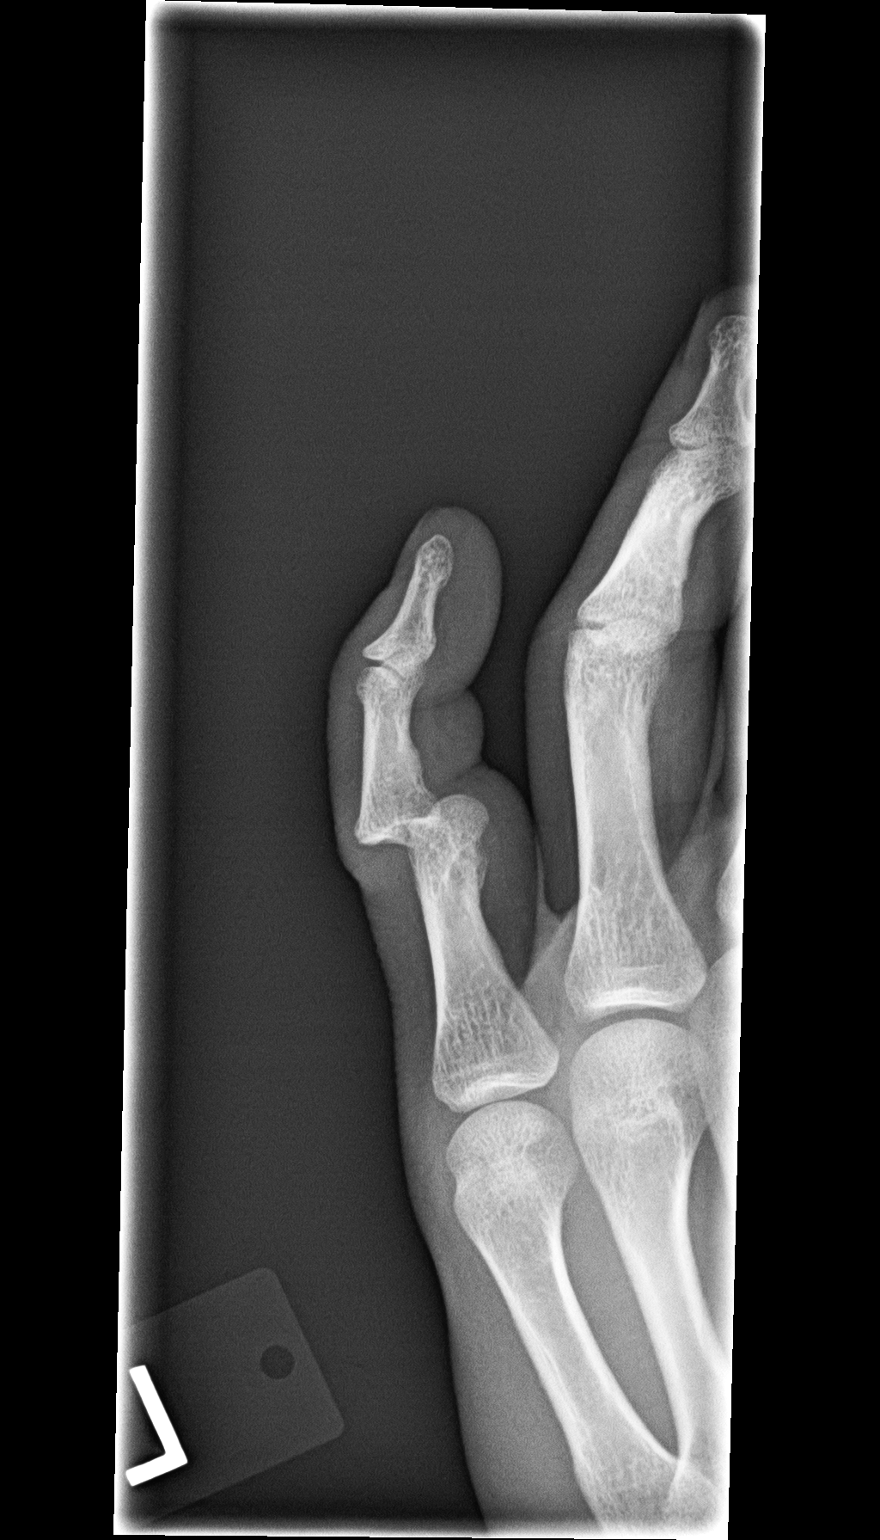

[view not recorded (3 of 3)]
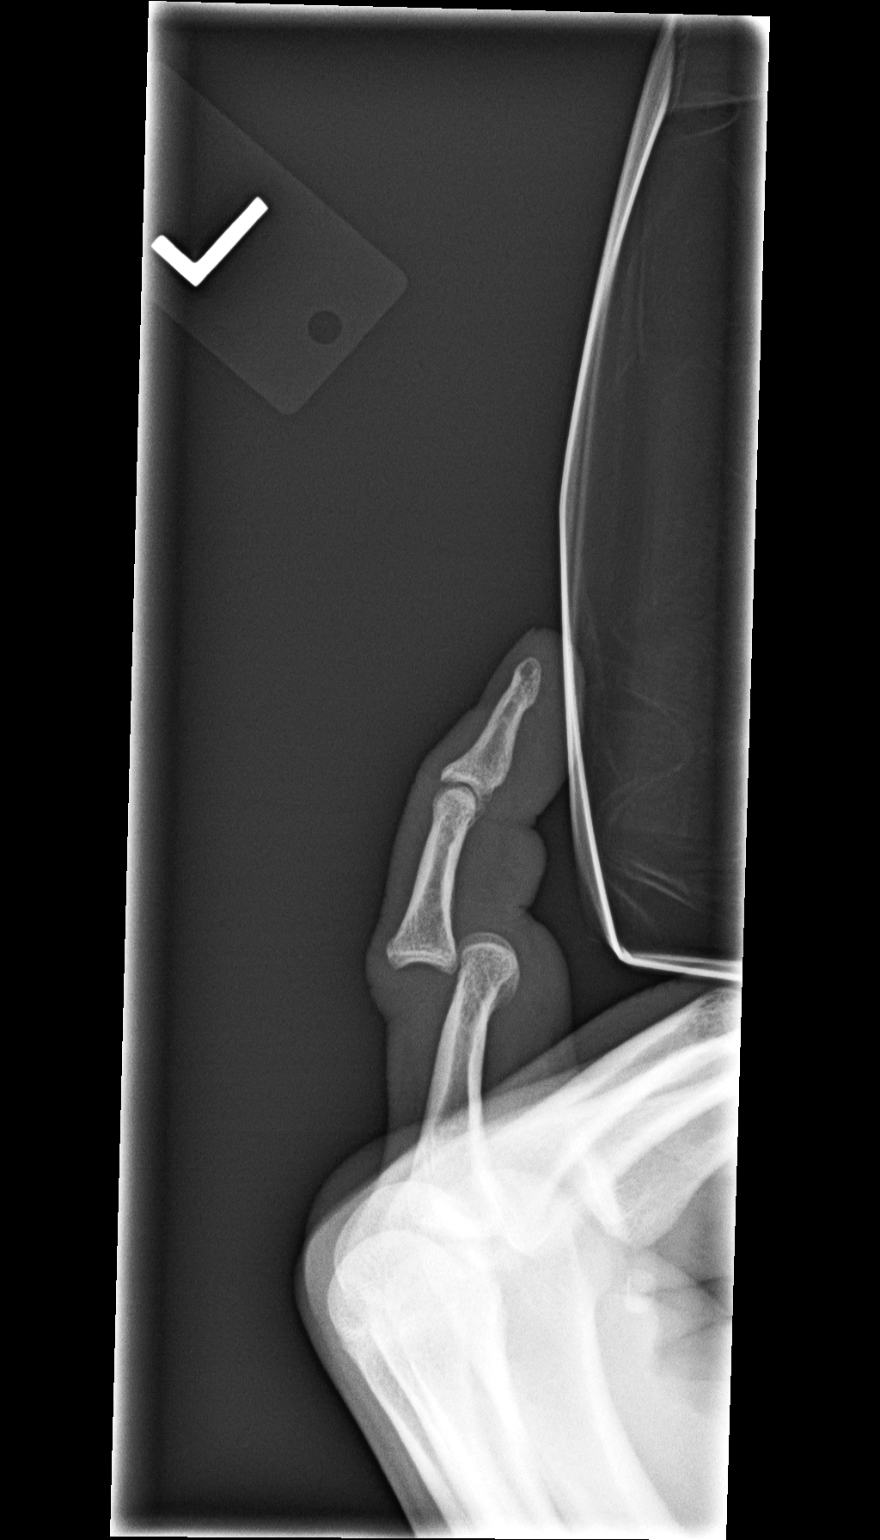

[3 of 3 positions shown; findings below may reference images not displayed]

FINDINGS: Three views of left fifth finger submitted. There is dorsal
subluxation of middle phalanx from proximal interphalangeal joint
IMPRESSION: Dorsal subluxation of middle phalanx proximal interphalangeal joint
left fifth finger.

## 2019-10-19 DIAGNOSIS — Z1339 Encounter for screening examination for other mental health and behavioral disorders: Secondary | ICD-10-CM | POA: Diagnosis not present

## 2019-10-19 DIAGNOSIS — Z1389 Encounter for screening for other disorder: Secondary | ICD-10-CM | POA: Diagnosis not present

## 2019-10-19 DIAGNOSIS — Z79899 Other long term (current) drug therapy: Secondary | ICD-10-CM | POA: Diagnosis not present

## 2019-10-19 DIAGNOSIS — F17210 Nicotine dependence, cigarettes, uncomplicated: Secondary | ICD-10-CM | POA: Diagnosis not present

## 2019-10-30 DIAGNOSIS — F17210 Nicotine dependence, cigarettes, uncomplicated: Secondary | ICD-10-CM | POA: Diagnosis not present

## 2019-10-30 DIAGNOSIS — Z79899 Other long term (current) drug therapy: Secondary | ICD-10-CM | POA: Diagnosis not present

## 2019-11-13 DIAGNOSIS — Z79899 Other long term (current) drug therapy: Secondary | ICD-10-CM | POA: Diagnosis not present

## 2019-11-13 DIAGNOSIS — F17210 Nicotine dependence, cigarettes, uncomplicated: Secondary | ICD-10-CM | POA: Diagnosis not present

## 2019-11-13 DIAGNOSIS — K219 Gastro-esophageal reflux disease without esophagitis: Secondary | ICD-10-CM | POA: Diagnosis not present

## 2019-12-13 DIAGNOSIS — Z79899 Other long term (current) drug therapy: Secondary | ICD-10-CM | POA: Diagnosis not present

## 2020-01-11 DIAGNOSIS — A048 Other specified bacterial intestinal infections: Secondary | ICD-10-CM | POA: Diagnosis not present

## 2020-01-11 DIAGNOSIS — Z79899 Other long term (current) drug therapy: Secondary | ICD-10-CM | POA: Diagnosis not present

## 2020-01-11 DIAGNOSIS — F17210 Nicotine dependence, cigarettes, uncomplicated: Secondary | ICD-10-CM | POA: Diagnosis not present

## 2020-01-11 DIAGNOSIS — R1084 Generalized abdominal pain: Secondary | ICD-10-CM | POA: Diagnosis not present

## 2020-01-11 DIAGNOSIS — K279 Peptic ulcer, site unspecified, unspecified as acute or chronic, without hemorrhage or perforation: Secondary | ICD-10-CM | POA: Diagnosis not present

## 2020-02-01 DIAGNOSIS — Z79899 Other long term (current) drug therapy: Secondary | ICD-10-CM | POA: Diagnosis not present

## 2020-03-05 DIAGNOSIS — Z79899 Other long term (current) drug therapy: Secondary | ICD-10-CM | POA: Diagnosis not present

## 2020-04-06 DIAGNOSIS — Z79899 Other long term (current) drug therapy: Secondary | ICD-10-CM | POA: Diagnosis not present

## 2020-04-10 DIAGNOSIS — R509 Fever, unspecified: Secondary | ICD-10-CM | POA: Diagnosis not present

## 2020-04-10 DIAGNOSIS — R300 Dysuria: Secondary | ICD-10-CM | POA: Diagnosis not present

## 2020-04-10 DIAGNOSIS — K259 Gastric ulcer, unspecified as acute or chronic, without hemorrhage or perforation: Secondary | ICD-10-CM | POA: Diagnosis not present

## 2020-04-10 DIAGNOSIS — R042 Hemoptysis: Secondary | ICD-10-CM | POA: Diagnosis not present

## 2020-04-10 DIAGNOSIS — K219 Gastro-esophageal reflux disease without esophagitis: Secondary | ICD-10-CM | POA: Diagnosis not present

## 2020-05-04 DIAGNOSIS — Z79899 Other long term (current) drug therapy: Secondary | ICD-10-CM | POA: Diagnosis not present

## 2020-06-01 DIAGNOSIS — Z79899 Other long term (current) drug therapy: Secondary | ICD-10-CM | POA: Diagnosis not present

## 2020-06-08 DIAGNOSIS — R1011 Right upper quadrant pain: Secondary | ICD-10-CM | POA: Diagnosis not present

## 2020-06-08 DIAGNOSIS — K838 Other specified diseases of biliary tract: Secondary | ICD-10-CM | POA: Diagnosis not present

## 2020-06-08 DIAGNOSIS — K2900 Acute gastritis without bleeding: Secondary | ICD-10-CM | POA: Diagnosis not present

## 2020-06-08 DIAGNOSIS — R935 Abnormal findings on diagnostic imaging of other abdominal regions, including retroperitoneum: Secondary | ICD-10-CM | POA: Diagnosis not present

## 2020-07-08 DIAGNOSIS — Z79899 Other long term (current) drug therapy: Secondary | ICD-10-CM | POA: Diagnosis not present

## 2020-07-20 DIAGNOSIS — H52221 Regular astigmatism, right eye: Secondary | ICD-10-CM | POA: Diagnosis not present

## 2020-08-05 DIAGNOSIS — Z79899 Other long term (current) drug therapy: Secondary | ICD-10-CM | POA: Diagnosis not present

## 2020-09-07 DIAGNOSIS — Z79899 Other long term (current) drug therapy: Secondary | ICD-10-CM | POA: Diagnosis not present

## 2020-10-05 DIAGNOSIS — F32A Depression, unspecified: Secondary | ICD-10-CM | POA: Diagnosis not present

## 2020-10-05 DIAGNOSIS — Z79899 Other long term (current) drug therapy: Secondary | ICD-10-CM | POA: Diagnosis not present

## 2020-10-12 DIAGNOSIS — M24445 Recurrent dislocation, left finger: Secondary | ICD-10-CM | POA: Diagnosis not present

## 2020-10-12 DIAGNOSIS — S63287A Dislocation of proximal interphalangeal joint of left little finger, initial encounter: Secondary | ICD-10-CM | POA: Diagnosis not present

## 2020-10-18 DIAGNOSIS — Z6829 Body mass index (BMI) 29.0-29.9, adult: Secondary | ICD-10-CM | POA: Diagnosis not present

## 2020-10-18 DIAGNOSIS — F411 Generalized anxiety disorder: Secondary | ICD-10-CM | POA: Diagnosis not present

## 2020-10-18 DIAGNOSIS — F322 Major depressive disorder, single episode, severe without psychotic features: Secondary | ICD-10-CM | POA: Diagnosis not present

## 2020-11-05 DIAGNOSIS — Z79899 Other long term (current) drug therapy: Secondary | ICD-10-CM | POA: Diagnosis not present

## 2020-11-17 DIAGNOSIS — R030 Elevated blood-pressure reading, without diagnosis of hypertension: Secondary | ICD-10-CM | POA: Diagnosis not present

## 2020-11-17 DIAGNOSIS — Z6828 Body mass index (BMI) 28.0-28.9, adult: Secondary | ICD-10-CM | POA: Diagnosis not present

## 2020-11-17 DIAGNOSIS — F322 Major depressive disorder, single episode, severe without psychotic features: Secondary | ICD-10-CM | POA: Diagnosis not present

## 2020-11-17 DIAGNOSIS — F411 Generalized anxiety disorder: Secondary | ICD-10-CM | POA: Diagnosis not present

## 2020-12-07 DIAGNOSIS — R300 Dysuria: Secondary | ICD-10-CM | POA: Diagnosis not present

## 2020-12-07 DIAGNOSIS — R030 Elevated blood-pressure reading, without diagnosis of hypertension: Secondary | ICD-10-CM | POA: Diagnosis not present

## 2020-12-07 DIAGNOSIS — Z79899 Other long term (current) drug therapy: Secondary | ICD-10-CM | POA: Diagnosis not present

## 2020-12-07 DIAGNOSIS — Z6828 Body mass index (BMI) 28.0-28.9, adult: Secondary | ICD-10-CM | POA: Diagnosis not present

## 2020-12-07 DIAGNOSIS — K219 Gastro-esophageal reflux disease without esophagitis: Secondary | ICD-10-CM | POA: Diagnosis not present

## 2020-12-31 DIAGNOSIS — Z6828 Body mass index (BMI) 28.0-28.9, adult: Secondary | ICD-10-CM | POA: Diagnosis not present

## 2020-12-31 DIAGNOSIS — Z79899 Other long term (current) drug therapy: Secondary | ICD-10-CM | POA: Diagnosis not present

## 2021-01-06 DIAGNOSIS — Z79899 Other long term (current) drug therapy: Secondary | ICD-10-CM | POA: Diagnosis not present

## 2021-01-12 DIAGNOSIS — F411 Generalized anxiety disorder: Secondary | ICD-10-CM | POA: Diagnosis not present

## 2021-01-12 DIAGNOSIS — F322 Major depressive disorder, single episode, severe without psychotic features: Secondary | ICD-10-CM | POA: Diagnosis not present

## 2021-01-12 DIAGNOSIS — Z6828 Body mass index (BMI) 28.0-28.9, adult: Secondary | ICD-10-CM | POA: Diagnosis not present

## 2021-02-02 DIAGNOSIS — Z79899 Other long term (current) drug therapy: Secondary | ICD-10-CM | POA: Diagnosis not present

## 2021-02-02 DIAGNOSIS — Z6828 Body mass index (BMI) 28.0-28.9, adult: Secondary | ICD-10-CM | POA: Diagnosis not present

## 2021-02-04 DIAGNOSIS — Z79899 Other long term (current) drug therapy: Secondary | ICD-10-CM | POA: Diagnosis not present

## 2021-03-04 DIAGNOSIS — Z6828 Body mass index (BMI) 28.0-28.9, adult: Secondary | ICD-10-CM | POA: Diagnosis not present

## 2021-03-04 DIAGNOSIS — Z79899 Other long term (current) drug therapy: Secondary | ICD-10-CM | POA: Diagnosis not present

## 2021-03-08 DIAGNOSIS — Z79899 Other long term (current) drug therapy: Secondary | ICD-10-CM | POA: Diagnosis not present

## 2021-03-28 DIAGNOSIS — Z6830 Body mass index (BMI) 30.0-30.9, adult: Secondary | ICD-10-CM | POA: Diagnosis not present

## 2021-03-28 DIAGNOSIS — R030 Elevated blood-pressure reading, without diagnosis of hypertension: Secondary | ICD-10-CM | POA: Diagnosis not present

## 2021-03-28 DIAGNOSIS — F411 Generalized anxiety disorder: Secondary | ICD-10-CM | POA: Diagnosis not present

## 2021-03-28 DIAGNOSIS — F322 Major depressive disorder, single episode, severe without psychotic features: Secondary | ICD-10-CM | POA: Diagnosis not present

## 2021-04-15 DIAGNOSIS — Z6828 Body mass index (BMI) 28.0-28.9, adult: Secondary | ICD-10-CM | POA: Diagnosis not present

## 2021-04-15 DIAGNOSIS — Z0000 Encounter for general adult medical examination without abnormal findings: Secondary | ICD-10-CM | POA: Diagnosis not present

## 2021-04-15 DIAGNOSIS — Z79899 Other long term (current) drug therapy: Secondary | ICD-10-CM | POA: Diagnosis not present

## 2021-04-19 DIAGNOSIS — Z79899 Other long term (current) drug therapy: Secondary | ICD-10-CM | POA: Diagnosis not present

## 2021-05-18 DIAGNOSIS — R1011 Right upper quadrant pain: Secondary | ICD-10-CM | POA: Diagnosis not present

## 2021-05-18 DIAGNOSIS — K219 Gastro-esophageal reflux disease without esophagitis: Secondary | ICD-10-CM | POA: Diagnosis not present

## 2021-05-28 DIAGNOSIS — K219 Gastro-esophageal reflux disease without esophagitis: Secondary | ICD-10-CM | POA: Diagnosis not present

## 2021-06-03 DIAGNOSIS — Z79899 Other long term (current) drug therapy: Secondary | ICD-10-CM | POA: Diagnosis not present

## 2021-06-03 DIAGNOSIS — Z6830 Body mass index (BMI) 30.0-30.9, adult: Secondary | ICD-10-CM | POA: Diagnosis not present

## 2021-06-03 DIAGNOSIS — R1011 Right upper quadrant pain: Secondary | ICD-10-CM | POA: Diagnosis not present

## 2021-06-03 DIAGNOSIS — Z6828 Body mass index (BMI) 28.0-28.9, adult: Secondary | ICD-10-CM | POA: Diagnosis not present

## 2021-06-03 DIAGNOSIS — K219 Gastro-esophageal reflux disease without esophagitis: Secondary | ICD-10-CM | POA: Diagnosis not present

## 2021-06-04 DIAGNOSIS — K200 Eosinophilic esophagitis: Secondary | ICD-10-CM | POA: Diagnosis not present

## 2021-06-07 DIAGNOSIS — Z79899 Other long term (current) drug therapy: Secondary | ICD-10-CM | POA: Diagnosis not present

## 2021-06-08 DIAGNOSIS — K209 Esophagitis, unspecified without bleeding: Secondary | ICD-10-CM | POA: Diagnosis not present

## 2021-07-14 ENCOUNTER — Other Ambulatory Visit (HOSPITAL_COMMUNITY): Payer: Self-pay | Admitting: Gastroenterology

## 2021-07-14 DIAGNOSIS — R112 Nausea with vomiting, unspecified: Secondary | ICD-10-CM

## 2021-07-22 ENCOUNTER — Ambulatory Visit (HOSPITAL_COMMUNITY): Admission: RE | Admit: 2021-07-22 | Payer: Medicaid Other | Source: Ambulatory Visit

## 2022-01-13 ENCOUNTER — Emergency Department (HOSPITAL_COMMUNITY): Payer: Medicaid Other

## 2022-01-13 ENCOUNTER — Encounter (HOSPITAL_COMMUNITY): Payer: Self-pay | Admitting: *Deleted

## 2022-01-13 ENCOUNTER — Emergency Department (HOSPITAL_COMMUNITY)
Admission: EM | Admit: 2022-01-13 | Discharge: 2022-01-13 | Disposition: A | Discharge status: Home / Self Care | Payer: Medicaid Other | Attending: Emergency Medicine | Admitting: Emergency Medicine

## 2022-01-13 ENCOUNTER — Other Ambulatory Visit: Payer: Self-pay

## 2022-01-13 DIAGNOSIS — Z79899 Other long term (current) drug therapy: Secondary | ICD-10-CM | POA: Insufficient documentation

## 2022-01-13 DIAGNOSIS — R112 Nausea with vomiting, unspecified: Secondary | ICD-10-CM | POA: Diagnosis not present

## 2022-01-13 DIAGNOSIS — R1084 Generalized abdominal pain: Secondary | ICD-10-CM | POA: Diagnosis not present

## 2022-01-13 DIAGNOSIS — R61 Generalized hyperhidrosis: Secondary | ICD-10-CM | POA: Diagnosis not present

## 2022-01-13 DIAGNOSIS — R509 Fever, unspecified: Secondary | ICD-10-CM | POA: Insufficient documentation

## 2022-01-13 LAB — CBC WITH DIFFERENTIAL/PLATELET
Abs Immature Granulocytes: 0.01 10*3/uL (ref 0.00–0.07)
Basophils Absolute: 0 10*3/uL (ref 0.0–0.1)
Basophils Relative: 1 %
Eosinophils Absolute: 0.2 10*3/uL (ref 0.0–0.5)
Eosinophils Relative: 6 %
HCT: 36.5 % — ABNORMAL LOW (ref 39.0–52.0)
Hemoglobin: 12.1 g/dL — ABNORMAL LOW (ref 13.0–17.0)
Immature Granulocytes: 0 %
Lymphocytes Relative: 32 %
Lymphs Abs: 1.2 10*3/uL (ref 0.7–4.0)
MCH: 27.4 pg (ref 26.0–34.0)
MCHC: 33.2 g/dL (ref 30.0–36.0)
MCV: 82.8 fL (ref 80.0–100.0)
Monocytes Absolute: 0.3 10*3/uL (ref 0.1–1.0)
Monocytes Relative: 7 %
Neutro Abs: 2.1 10*3/uL (ref 1.7–7.7)
Neutrophils Relative %: 54 %
Platelets: 255 10*3/uL (ref 150–400)
RBC: 4.41 MIL/uL (ref 4.22–5.81)
RDW: 13.5 % (ref 11.5–15.5)
WBC: 3.8 10*3/uL — ABNORMAL LOW (ref 4.0–10.5)
nRBC: 0 % (ref 0.0–0.2)

## 2022-01-13 LAB — RAPID URINE DRUG SCREEN, HOSP PERFORMED
Amphetamines: NOT DETECTED
Barbiturates: NOT DETECTED
Benzodiazepines: POSITIVE — AB
Cocaine: NOT DETECTED
Opiates: NOT DETECTED
Tetrahydrocannabinol: NOT DETECTED

## 2022-01-13 LAB — COMPREHENSIVE METABOLIC PANEL
ALT: 26 U/L (ref 0–44)
AST: 22 U/L (ref 15–41)
Albumin: 3.4 g/dL — ABNORMAL LOW (ref 3.5–5.0)
Alkaline Phosphatase: 75 U/L (ref 38–126)
Anion gap: 5 (ref 5–15)
BUN: 6 mg/dL (ref 6–20)
CO2: 26 mmol/L (ref 22–32)
Calcium: 8.7 mg/dL — ABNORMAL LOW (ref 8.9–10.3)
Chloride: 109 mmol/L (ref 98–111)
Creatinine, Ser: 0.5 mg/dL — ABNORMAL LOW (ref 0.61–1.24)
GFR, Estimated: >60 mL/min (ref >60)
Glucose, Bld: 107 mg/dL — ABNORMAL HIGH (ref 70–99)
Potassium: 3.6 mmol/L (ref 3.5–5.1)
Sodium: 140 mmol/L (ref 135–145)
Total Bilirubin: 0.7 mg/dL (ref 0.3–1.2)
Total Protein: 6.9 g/dL (ref 6.5–8.1)

## 2022-01-13 LAB — LACTIC ACID, PLASMA
Lactic Acid, Venous: 1 mmol/L (ref 0.5–1.9)
Lactic Acid, Venous: 1.1 mmol/L (ref 0.5–1.9)

## 2022-01-13 LAB — TROPONIN I (HIGH SENSITIVITY)
Troponin I (High Sensitivity): <2 ng/L (ref <18)
Troponin I (High Sensitivity): <2 ng/L (ref <18)

## 2022-01-13 LAB — SEDIMENTATION RATE: Sed Rate: 54 mm/hr — ABNORMAL HIGH (ref 0–16)

## 2022-01-13 LAB — C-REACTIVE PROTEIN: CRP: 3.3 mg/dL — ABNORMAL HIGH (ref <1.0)

## 2022-01-13 MED ORDER — IOHEXOL 300 MG/ML  SOLN
100.0000 mL | Freq: Once | INTRAMUSCULAR | Status: AC | PRN
Start: 2022-01-13 — End: 2022-01-13
  Administered 2022-01-13: 100 mL via INTRAVENOUS

## 2022-01-13 MED ORDER — AMOXICILLIN-POT CLAVULANATE 875-125 MG PO TABS: 1.0000 | ORAL_TABLET | Freq: Two times a day (BID) | ORAL | 0 refills | Status: AC

## 2022-01-13 NOTE — ED Provider Notes (Signed)
[facility] EMERGENCY DEPARTMENT Provider Note   CSN: [id number] Arrival date & time: 01/13/22  1219     History Chief Complaint  Patient presents with   Fever    Rodney Whitaker is a 31 y.o. male patient who presents to the emergency department with intermittent recurrent fevers that have been going on for about 6 months.  This most recent episode started on Sunday where he started having a fever up to 102-103 at home.  He states that when this happens he gets very nauseous and begins vomiting up bile.  He has seen his primary care doctor for this and had endoscopy with biopsies which confirmed that he was indeed vomiting up bile.  He was supposed to have a HIDA scan to evaluate for possible gallbladder pathology but insurance denied the request.  This particular episode worsened on Thursday when he had a fever up to 104 with associated diaphoresis, rigors, nausea, and vomiting.  Patient had a fever up to 101 this morning and took ibuprofen.  Denies any urinary complaints, sore throat, shortness of breath.  Patient is having some epigastric pain in addition to central chest pain.   Fever      Home Medications Prior to Admission medications   Medication Sig Start Date End Date Taking? Authorizing Provider  amoxicillin-clavulanate (AUGMENTIN) 875-125 MG tablet Take 1 tablet by mouth every 12 (twelve) hours. 01/13/22  Yes Raul Del, Zian Delair M, PA-C  Buprenorphine HCl-Naloxone HCl 8-2 MG FILM Place 2 strips under the tongue daily. 12/15/21  Yes [provider]  clonazePAM (KLONOPIN) 0.5 MG tablet Take 0.5 mg by mouth 2 (two) times daily as needed for anxiety. 01/03/22  Yes [provider]  esomeprazole (NEXIUM) 40 MG capsule Take 40 mg by mouth 2 (two) times daily. 12/04/21  Yes [provider]  famotidine (PEPCID) 40 MG tablet Take 40 mg by mouth at bedtime. 12/04/21  Yes [provider]  ibuprofen (ADVIL) 800 MG tablet Take 800 mg by mouth every 8 (eight) hours  as needed for fever or moderate pain.   Yes [provider]  Multiple Vitamin (MULTIVITAMIN WITH MINERALS) TABS tablet Take 1 tablet by mouth daily.   Yes [provider]  Nutritional Supplements (DHEA PO) Take 1 tablet by mouth daily.   Yes [provider]  sucralfate (CARAFATE) 1 g tablet Take 1 g by mouth 4 (four) times daily. 01/03/22  Yes [provider]  traZODone (DESYREL) 150 MG tablet Take 1 tablet (150 mg total) by mouth at bedtime. Patient not taking: Reported on 10/04/2015 04/15/15 10/04/15  Claretta Fraise, MD      Allergies    Patient has no known allergies.    Review of Systems   Review of Systems  Constitutional:  Positive for fever.  All other systems reviewed and are negative.   Physical Exam Updated Vital Signs BP 110/78   Pulse (!) 53   Temp 98.5 F (36.9 C) (Oral)   Resp 17   Ht 6' 1"  (1.854 m)   Wt 104.3 kg   SpO2 100%   BMI 30.34 kg/m  Physical Exam Vitals and nursing note reviewed.  Constitutional:      General: He is not in acute distress.    Appearance: Normal appearance.  HENT:     Head: Normocephalic and atraumatic.  Eyes:     General:        Right eye: No discharge.        Left eye: No discharge.  Cardiovascular:  Comments: Regular rate and rhythm.  S1/S2 are distinct without any evidence of murmur, rubs, or gallops.  Radial pulses are 2+ bilaterally.  Dorsalis pedis pulses are 2+ bilaterally.  No evidence of pedal edema. Pulmonary:     Comments: Clear to auscultation bilaterally.  Normal effort.  No respiratory distress.  No evidence of wheezes, rales, or rhonchi heard throughout. Abdominal:     General: Abdomen is flat. Bowel sounds are normal. There is no distension.     Tenderness: There is abdominal tenderness. There is no guarding or rebound.  Musculoskeletal:        General: Normal range of motion.     Cervical back: Neck supple.  Skin:    General: Skin is warm and dry.     Findings: No rash.   Neurological:     General: No focal deficit present.     Mental Status: He is alert.  Psychiatric:        Mood and Affect: Mood normal.        Behavior: Behavior normal.     ED Results / Procedures / Treatments   Labs (all labs ordered are listed, but only abnormal results are displayed) Labs Reviewed  CBC WITH DIFFERENTIAL/PLATELET - Abnormal; Notable for the following components:      Result Value   WBC 3.8 (*)    Hemoglobin 12.1 (*)    HCT 36.5 (*)    All other components within normal limits  COMPREHENSIVE METABOLIC PANEL - Abnormal; Notable for the following components:   Glucose, Bld 107 (*)    Creatinine, Ser 0.50 (*)    Calcium 8.7 (*)    Albumin 3.4 (*)    All other components within normal limits  SEDIMENTATION RATE - Abnormal; Notable for the following components:   Sed Rate 54 (*)    All other components within normal limits  C-REACTIVE PROTEIN - Abnormal; Notable for the following components:   CRP 3.3 (*)    All other components within normal limits  RAPID URINE DRUG SCREEN, HOSP PERFORMED - Abnormal; Notable for the following components:   Benzodiazepines POSITIVE (*)    All other components within normal limits  CULTURE, BLOOD (ROUTINE X 2)  CULTURE, BLOOD (ROUTINE X 2)  LACTIC ACID, PLASMA  LACTIC ACID, PLASMA  TROPONIN I (HIGH SENSITIVITY)  TROPONIN I (HIGH SENSITIVITY)    EKG EKG Interpretation  Date/Time:  Thursday January 13 2022 12:45:32 EDT Ventricular Rate:  70 PR Interval:  168 QRS Duration: 88 QT Interval:  388 QTC Calculation: 419 R Axis:   48 Text Interpretation: Normal sinus rhythm Normal ECG When compared with ECG of 14-Jun-2016 18:51, No significant change was found Confirmed by Aletta Edouard [phone]) on 01/13/2022 12:59:06 PM  Radiology CT ABDOMEN PELVIS W CONTRAST  Result Date: 01/13/2022 CLINICAL DATA:  Abdominal pain, nausea, vomiting EXAM: CT ABDOMEN AND PELVIS WITH CONTRAST TECHNIQUE: Multidetector CT imaging of the abdomen  and pelvis was performed using the standard protocol following bolus administration of intravenous contrast. RADIATION DOSE REDUCTION: This exam was performed according to the departmental dose-optimization program which includes automated exposure control, adjustment of the mA and/or kV according to patient size and/or use of iterative reconstruction technique. CONTRAST:  118m OMNIPAQUE IOHEXOL 300 MG/ML  SOLN COMPARISON:  04/20/2012 FINDINGS: Lower chest: Small patchy infiltrates are seen in the posterior aspect of both lower lung fields. There is interval appearance of 11 mm pleural-based nodular density in the anterior left lower lobe. There are other smaller nodular densities  in the left lower lobe. There is faint 5 mm nodular density in the right lower lobe. There is no pleural effusion. Hepatobiliary: There is fatty infiltration in the liver. No focal abnormality is seen. There is no dilation of bile ducts. Gallbladder is unremarkable. Pancreas: No focal abnormality is seen. Spleen: Unremarkable. Adrenals/Urinary Tract: Adrenals are not enlarged. There is no hydronephrosis. There are no renal or ureteral stones. Urinary bladder is not distended. Stomach/Bowel: Small to moderate sized hiatal hernia is seen. Small bowel loops are not dilated. There is mild diffuse wall thickening in the distal ileum. Appendix is not dilated. There is no significant wall thickening in the colon. There is no pericolic stranding. Vascular/Lymphatic: Unremarkable. Reproductive: Unremarkable. Other: There is no ascites or pneumoperitoneum. Small umbilical hernia containing fat is seen. Musculoskeletal: No acute findings are seen. IMPRESSION: There is no evidence of intestinal obstruction or pneumoperitoneum. There is no hydronephrosis. Appendix is not dilated. Mild diffuse wall thickening in the distal and terminal ileum may be due to incomplete distention or suggest chronic nonspecific enteritis. Fatty liver.  Small to moderate  sized hiatal hernia. Small patchy infiltrates are seen in both lower lung fields suggesting atelectasis/pneumonia. There are few new nodular densities in both lower lung fields largest measuring 11 mm in size. Non-contrast chest CT at 3-6 months is recommended. If the nodules are stable at time of repeat CT, then future CT at 18-24 months (from today's scan) is considered optional for low-risk patients, but is recommended for high-risk patients. This recommendation follows the consensus statement: Guidelines for Management of Incidental Pulmonary Nodules Detected on CT Images: From the Fleischner Society 2017; Radiology 2017; 284:228-243. Other findings as described in the body of the report. Electronically Signed   By: Elmer Picker M.D.   On: 01/13/2022 16:58   DG Chest 2 View  Result Date: 01/13/2022 CLINICAL DATA:  Ongoing recurrent fever every 2 weeks.  Chest pain. EXAM: CHEST - 2 VIEW COMPARISON:  Chest two views 06/14/2016 FINDINGS: Cardiac silhouette and mediastinal contours are within normal limits. The lungs are clear. No pleural effusion or pneumothorax. No significant skeletal abnormality. IMPRESSION: No active cardiopulmonary disease. Electronically Signed   By: Yvonne Kendall M.D.   On: 01/13/2022 13:00    Procedures Procedures    Medications Ordered in ED Medications  iohexol (OMNIPAQUE) 300 MG/ML solution 100 mL (100 mLs Intravenous Contrast Given 01/13/22 1634)    ED Course/ Medical Decision Making/ A&P Clinical Course as of 01/13/22 1728  Thu Jan 13, 2022  1723 CBC with Differential(!) CBC shows evidence of leukopenia and mild anemia in comparison to previous. [CF]  1724 Comprehensive metabolic panel(!) No evidence of electrolyte derangements.  Creatinine a little bit below normal.  This could be from lack of p.o. intake. [CF]  1724 Sedimentation rate(!) ESR elevated. [CF]  1724 CRP also elevated. [CF]  1724 Lactic acid, plasma Initial lactic is normal. [CF]  1724  Troponin I (High Sensitivity) Initial and delta troponin are negative. [CF]  1725 Rapid urine drug screen (hospital performed)(!) UDS is positive for benzodiazepines. [CF]  20 CT ABDOMEN PELVIS W CONTRAST I personally ordered and interpreted the CT abdomen pelvis with contrast which did show evidence of enterocolitis.  We will cover him with Augmentin. [CF]  7824 DG Chest 2 View I personally ordered and interpreted a chest x-ray which was negative for any pneumonia or pleural effusions. [CF]    Clinical Course User Index [CF] Hendricks Limes, [place]  Medical Decision Making Andric Kerce is a 31 y.o. male patient who presents to the emergency room today for further evaluation of intermittent fevers, nausea, and vomiting.  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  He is afebrile here and vital signs are normal.  Patient denies IV drug use to me today in addition to any alcohol.  Patient is having some tenderness we will evaluate the CT scan.   Amount and/or Complexity of Data Reviewed Labs: ordered. Decision-making details documented in ED Course. Radiology: ordered and independent interpretation performed. Decision-making details documented in ED Course.  Risk Prescription drug management. Drug therapy requiring intensive monitoring for toxicity. Decision regarding hospitalization. Risk Details: Patient does not meet inpatient criteria at this time.  Work-up does reveal evidence of possible enterocolitis which could be causing his symptoms.  I am going to cover him with Augmentin.  Patient is in no acute distress at this time and needs to go to class and requested to be discharged.  I think this is a reasonable plan.  Most of his work-up is back.  A low suspicion for any ACS at this time or surgical abdomen.  I am going to have him follow-up with gastroenterology for further evaluation.  He is safe for discharge.    Final  Clinical Impression(s) / ED Diagnoses Final diagnoses:  Generalized abdominal pain  Fever, unspecified fever cause    Rx / DC Orders ED Discharge Orders          Ordered    amoxicillin-clavulanate (AUGMENTIN) 875-125 MG tablet  Every 12 hours        01/13/22 1719              Cherrie Gauze 01/13/22 1728    Luna Fuse, MD 01/21/22 [phone]

## 2022-01-13 NOTE — Discharge Instructions (Addendum)
I am going to also prescribe you Augmentin to cover you for any GI bacterial illness.  Please take as prescribed.  We will stick to a bland diet for the next several days and still incorporate normal diet.  Please call gastroenterology which I provided on your paperwork to schedule an appointment.  Please return to the emergency department for any worsening symptoms you might have.

## 2022-01-13 NOTE — ED Triage Notes (Addendum)
BIB family from home for ongoing recurrent fever, high fever every 2 weeks, highest 104.5. Denies nausea at this time, or diarrhea. Last emesis 2d ago. Mentions CP. Seen by GI and believes it is r/t gallbladder. Scheduled HIDA scan was cancelled.  Ibuprofen 800mg  taken at 1000.

## 2022-01-18 LAB — CULTURE, BLOOD (ROUTINE X 2)
Culture: NO GROWTH
Culture: NO GROWTH
Special Requests: ADEQUATE

## 2023-09-03 IMAGING — CT CT ABD-PELV W/ CM
2 of 4 series · 15 of 46 positions shown, 17 images · IV contrast (Omnipaque or Isovue)
Comparison: 04/20/2012

CLINICAL DATA: Abdominal pain, nausea, vomiting

EXAM:
CT ABDOMEN AND PELVIS WITH CONTRAST
TECHNIQUE: Multidetector CT imaging of the abdomen and pelvis was performed
using the standard protocol following bolus administration of
intravenous contrast.

[Series 2: axial st · axial · 0.85mm/px · z∈[+638,+1143]mm · 12 of 113 slices shown, 14 images]
[im 6/113  soft-tissue]
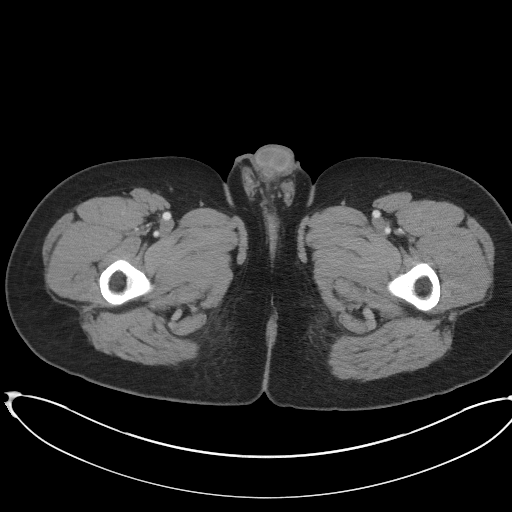
[im 6/113  bone]
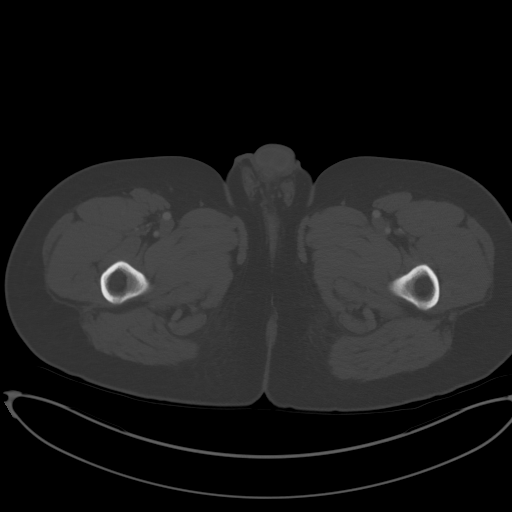
[im 16/113  soft-tissue]
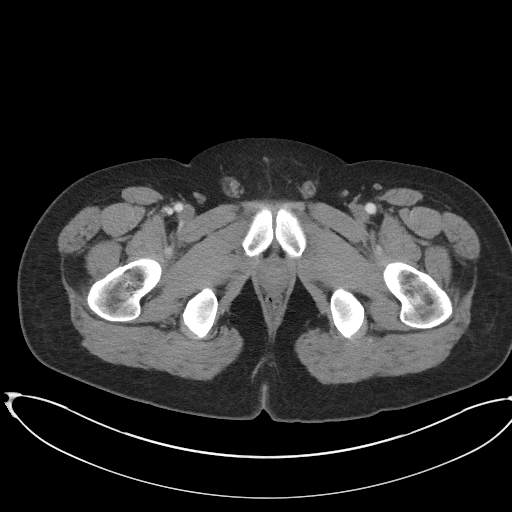
[im 26/113  soft-tissue]
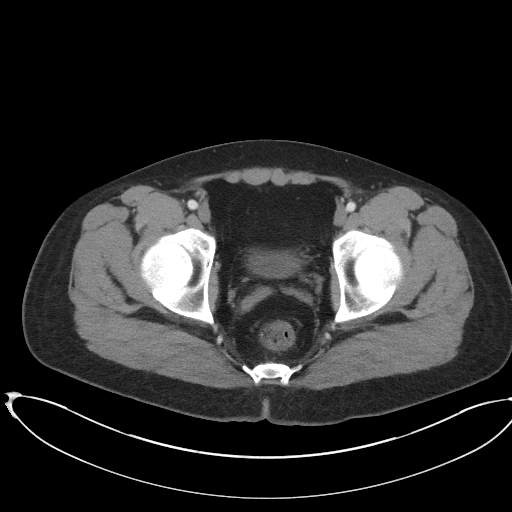
[im 36/113  soft-tissue]
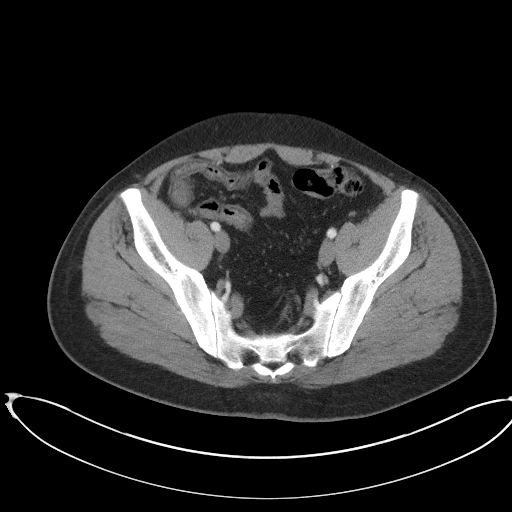
[im 41/113  soft-tissue]
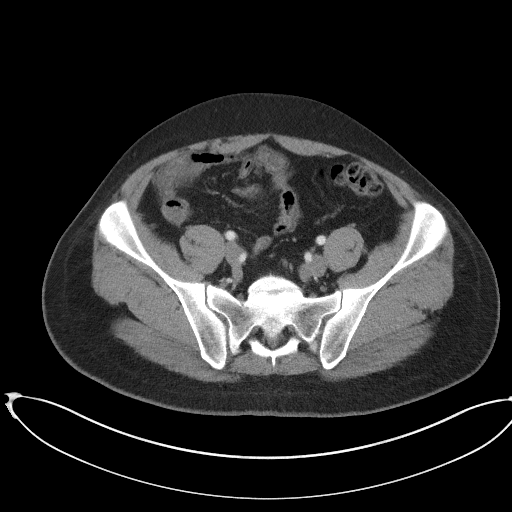
[im 51/113  soft-tissue]
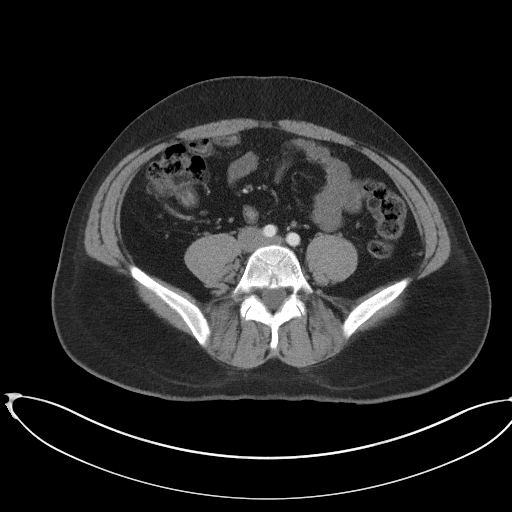
[im 62/113  soft-tissue]
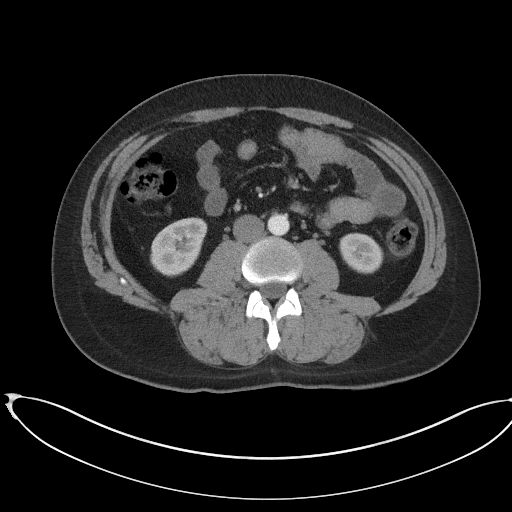
[im 72/113  soft-tissue]
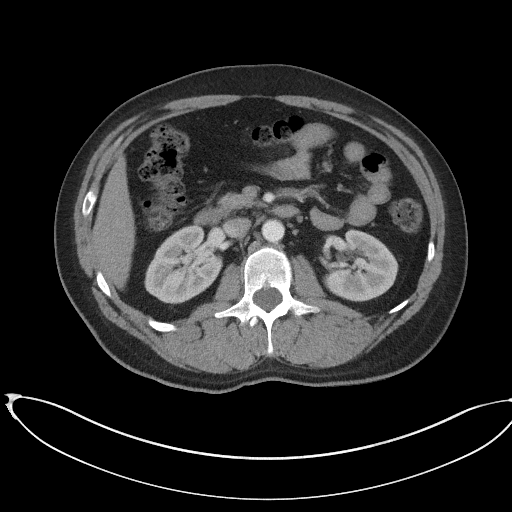
[im 77/113  soft-tissue]
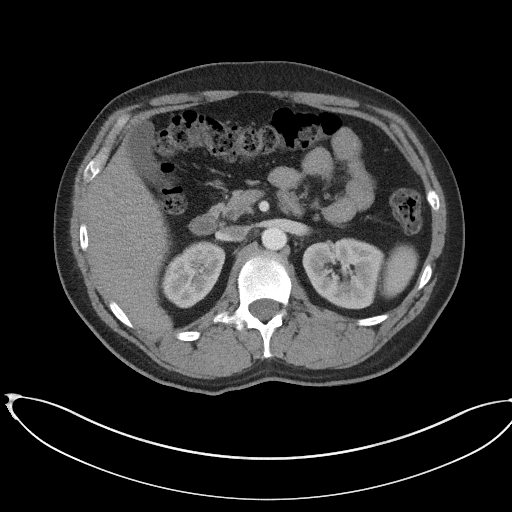
[im 77/113  bone]
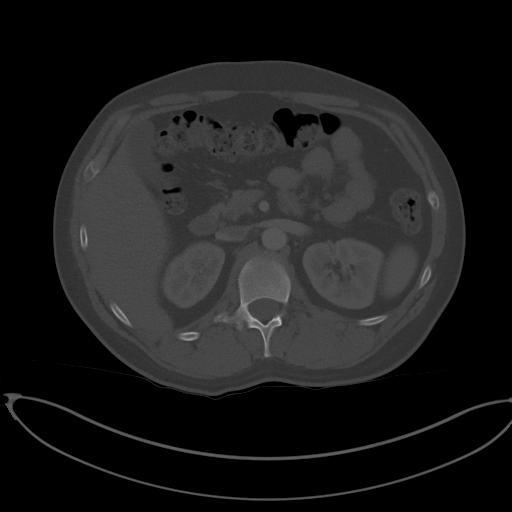
[im 87/113  soft-tissue]
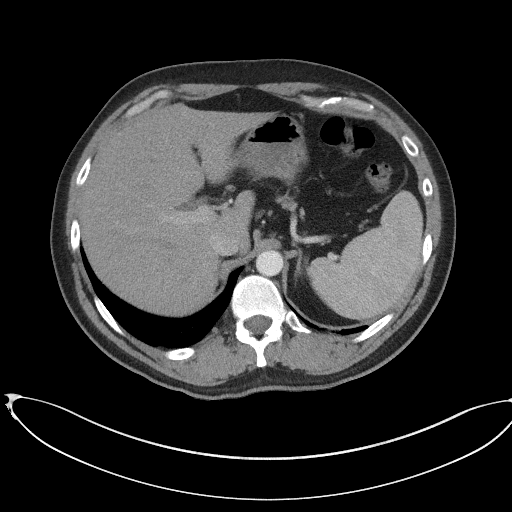
[im 97/113  soft-tissue]
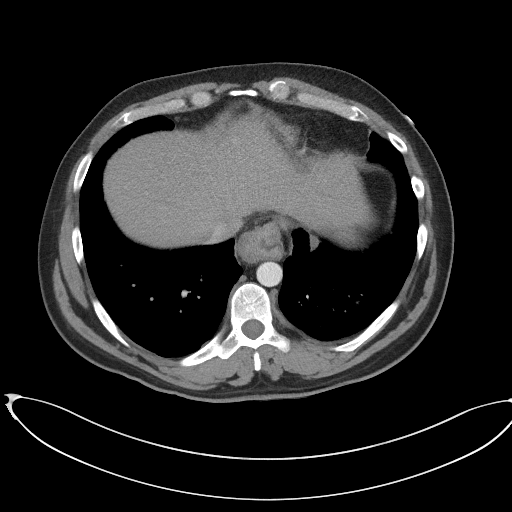
[im 107/113  soft-tissue]
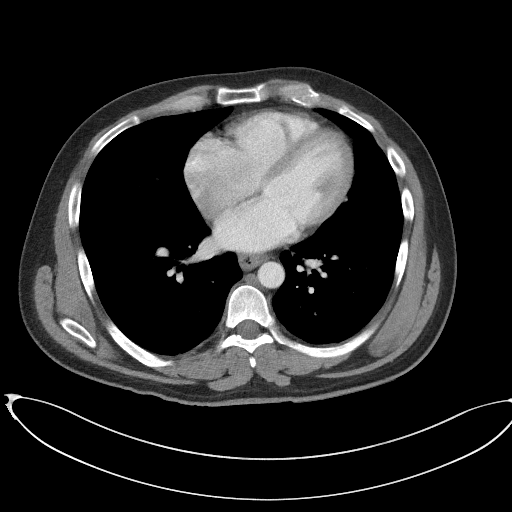

[Series 5: coronal st · coronal · 0.98mm/px · 3 of 117 slices shown]
[im 39/117  soft-tissue]
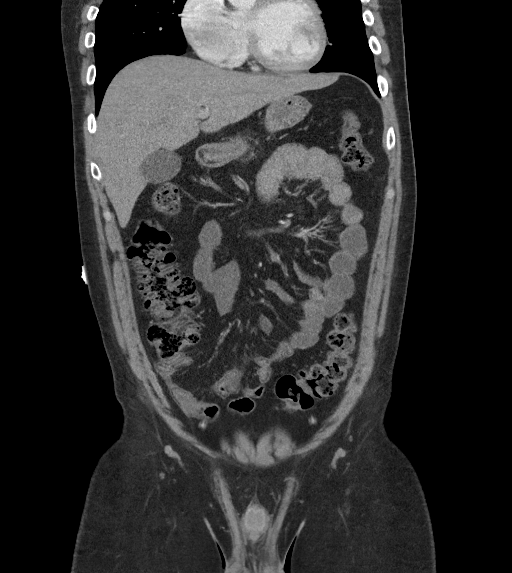
[im 52/117  soft-tissue]
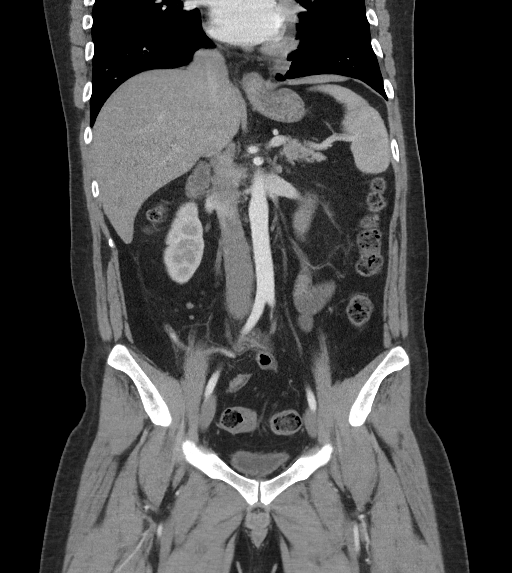
[im 65/117  soft-tissue]
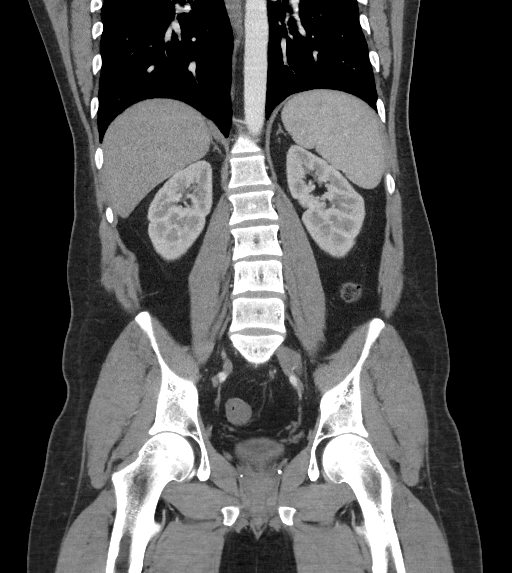

[15 of 46 positions shown; findings below may reference images not displayed]

RADIATION DOSE REDUCTION: This exam was performed according to the
departmental dose-optimization program which includes automated
exposure control, adjustment of the mA and/or kV according to
patient size and/or use of iterative reconstruction technique.

CONTRAST:  100mL OMNIPAQUE IOHEXOL 300 MG/ML  SOLN
FINDINGS: Lower chest: Small patchy infiltrates are seen in the posterior
aspect of both lower lung fields. There is interval appearance of 11
mm pleural-based nodular density in the anterior left lower lobe.
There are other smaller nodular densities in the left lower lobe.
There is faint 5 mm nodular density in the right lower lobe. There
is no pleural effusion.

Hepatobiliary: There is fatty infiltration in the liver. No focal
abnormality is seen. There is no dilation of bile ducts. Gallbladder
is unremarkable.

Pancreas: No focal abnormality is seen.

Spleen: Unremarkable.

Adrenals/Urinary Tract: Adrenals are not enlarged. There is no
hydronephrosis. There are no renal or ureteral stones. Urinary
bladder is not distended.

Stomach/Bowel: Small to moderate sized hiatal hernia is seen. Small
bowel loops are not dilated. There is mild diffuse wall thickening
in the distal ileum. Appendix is not dilated. There is no
significant wall thickening in the colon. There is no pericolic
stranding.

Vascular/Lymphatic: Unremarkable.

Reproductive: Unremarkable.

Other: There is no ascites or pneumoperitoneum. Small umbilical
hernia containing fat is seen.

Musculoskeletal: No acute findings are seen.
IMPRESSION: There is no evidence of intestinal obstruction or pneumoperitoneum.
There is no hydronephrosis. Appendix is not dilated.

Mild diffuse wall thickening in the distal and terminal ileum may be
due to incomplete distention or suggest chronic nonspecific
enteritis.

Fatty liver.  Small to moderate sized hiatal hernia.

Small patchy infiltrates are seen in both lower lung fields
suggesting atelectasis/pneumonia.

There are few new nodular densities in both lower lung fields
largest measuring 11 mm in size. Non-contrast chest CT at 3-6 months
is recommended. If the nodules are stable at time of repeat CT, then
future CT at 18-24 months (from today's scan) is considered optional
for low-risk patients, but is recommended for high-risk patients.
This recommendation follows the consensus statement: Guidelines for
Management of Incidental Pulmonary Nodules Detected on CT Images:

Other findings as described in the body of the report.
